# Patient Record
Sex: Male | Born: 1945 | Race: White | Hispanic: No | Marital: Married | Smoking: Never smoker
Health system: Southern US, Community
[De-identification: ages and names within clinical notes are randomized; demographics above are authoritative.]

## PROBLEM LIST (undated history)

## (undated) DIAGNOSIS — I10 Essential (primary) hypertension: Secondary | ICD-10-CM

## (undated) DIAGNOSIS — E785 Hyperlipidemia, unspecified: Secondary | ICD-10-CM

## (undated) HISTORY — DX: Essential (primary) hypertension: I10

## (undated) HISTORY — DX: Hyperlipidemia, unspecified: E78.5

---

## 2000-03-08 ENCOUNTER — Emergency Department (HOSPITAL_COMMUNITY): Admission: EM | Admit: 2000-03-08 | Discharge: 2000-03-08 | Payer: Self-pay | Admitting: Emergency Medicine

## 2003-03-31 ENCOUNTER — Emergency Department (HOSPITAL_COMMUNITY): Admission: EM | Admit: 2003-03-31 | Discharge: 2003-03-31 | Payer: Self-pay | Admitting: Emergency Medicine

## 2003-05-28 ENCOUNTER — Encounter: Admission: RE | Admit: 2003-05-28 | Discharge: 2003-05-28 | Payer: Self-pay | Admitting: Family Medicine

## 2003-06-29 ENCOUNTER — Ambulatory Visit (HOSPITAL_COMMUNITY): Admission: RE | Admit: 2003-06-29 | Discharge: 2003-06-29 | Payer: Self-pay | Admitting: Gastroenterology

## 2003-07-02 ENCOUNTER — Encounter: Admission: RE | Admit: 2003-07-02 | Discharge: 2003-07-02 | Payer: Self-pay | Admitting: Family Medicine

## 2017-08-19 DIAGNOSIS — R7303 Prediabetes: Secondary | ICD-10-CM | POA: Diagnosis not present

## 2017-08-19 DIAGNOSIS — Z79899 Other long term (current) drug therapy: Secondary | ICD-10-CM | POA: Diagnosis not present

## 2017-08-19 DIAGNOSIS — R69 Illness, unspecified: Secondary | ICD-10-CM | POA: Diagnosis not present

## 2017-08-19 DIAGNOSIS — Z0000 Encounter for general adult medical examination without abnormal findings: Secondary | ICD-10-CM | POA: Diagnosis not present

## 2017-08-19 DIAGNOSIS — N529 Male erectile dysfunction, unspecified: Secondary | ICD-10-CM | POA: Diagnosis not present

## 2017-08-19 DIAGNOSIS — E785 Hyperlipidemia, unspecified: Secondary | ICD-10-CM | POA: Diagnosis not present

## 2017-08-19 DIAGNOSIS — R030 Elevated blood-pressure reading, without diagnosis of hypertension: Secondary | ICD-10-CM | POA: Diagnosis not present

## 2017-10-05 DIAGNOSIS — R0981 Nasal congestion: Secondary | ICD-10-CM | POA: Diagnosis not present

## 2018-03-08 DIAGNOSIS — D225 Melanocytic nevi of trunk: Secondary | ICD-10-CM | POA: Diagnosis not present

## 2018-03-08 DIAGNOSIS — L570 Actinic keratosis: Secondary | ICD-10-CM | POA: Diagnosis not present

## 2018-03-08 DIAGNOSIS — D1801 Hemangioma of skin and subcutaneous tissue: Secondary | ICD-10-CM | POA: Diagnosis not present

## 2018-03-08 DIAGNOSIS — L814 Other melanin hyperpigmentation: Secondary | ICD-10-CM | POA: Diagnosis not present

## 2018-03-12 DIAGNOSIS — R69 Illness, unspecified: Secondary | ICD-10-CM | POA: Diagnosis not present

## 2018-05-10 ENCOUNTER — Observation Stay (HOSPITAL_COMMUNITY)
Admission: EM | Admit: 2018-05-10 | Discharge: 2018-05-12 | Disposition: A | Discharge status: Home / Self Care | Payer: Medicare HMO | Attending: Internal Medicine | Admitting: Internal Medicine

## 2018-05-10 ENCOUNTER — Emergency Department (HOSPITAL_COMMUNITY)
Admission: EM | Admit: 2018-05-10 | Discharge: 2018-05-10 | Disposition: A | Discharge status: Home / Self Care | Payer: Medicare HMO | Source: Home / Self Care | Attending: Emergency Medicine | Admitting: Emergency Medicine

## 2018-05-10 ENCOUNTER — Encounter (HOSPITAL_COMMUNITY): Payer: Self-pay | Admitting: Emergency Medicine

## 2018-05-10 ENCOUNTER — Emergency Department (HOSPITAL_COMMUNITY): Payer: Medicare HMO

## 2018-05-10 ENCOUNTER — Encounter (HOSPITAL_COMMUNITY): Payer: Self-pay

## 2018-05-10 ENCOUNTER — Other Ambulatory Visit: Payer: Self-pay

## 2018-05-10 DIAGNOSIS — R110 Nausea: Secondary | ICD-10-CM | POA: Diagnosis not present

## 2018-05-10 DIAGNOSIS — R0602 Shortness of breath: Secondary | ICD-10-CM | POA: Diagnosis not present

## 2018-05-10 DIAGNOSIS — R51 Headache: Secondary | ICD-10-CM

## 2018-05-10 DIAGNOSIS — I10 Essential (primary) hypertension: Secondary | ICD-10-CM | POA: Diagnosis not present

## 2018-05-10 DIAGNOSIS — Z79899 Other long term (current) drug therapy: Secondary | ICD-10-CM | POA: Insufficient documentation

## 2018-05-10 DIAGNOSIS — Z7982 Long term (current) use of aspirin: Secondary | ICD-10-CM | POA: Diagnosis not present

## 2018-05-10 DIAGNOSIS — R232 Flushing: Secondary | ICD-10-CM | POA: Diagnosis present

## 2018-05-10 DIAGNOSIS — I160 Hypertensive urgency: Secondary | ICD-10-CM | POA: Diagnosis not present

## 2018-05-10 DIAGNOSIS — E785 Hyperlipidemia, unspecified: Secondary | ICD-10-CM | POA: Diagnosis present

## 2018-05-10 DIAGNOSIS — R531 Weakness: Secondary | ICD-10-CM | POA: Diagnosis not present

## 2018-05-10 DIAGNOSIS — R0789 Other chest pain: Secondary | ICD-10-CM

## 2018-05-10 DIAGNOSIS — R079 Chest pain, unspecified: Secondary | ICD-10-CM

## 2018-05-10 DIAGNOSIS — R61 Generalized hyperhidrosis: Secondary | ICD-10-CM | POA: Diagnosis not present

## 2018-05-10 DIAGNOSIS — R0902 Hypoxemia: Secondary | ICD-10-CM | POA: Diagnosis not present

## 2018-05-10 LAB — BASIC METABOLIC PANEL
Anion gap: 13 (ref 5–15)
BUN: 16 mg/dL (ref 8–23)
CO2: 23 mmol/L (ref 22–32)
Calcium: 8.6 mg/dL — ABNORMAL LOW (ref 8.9–10.3)
Chloride: 104 mmol/L (ref 98–111)
Creatinine, Ser: 1.22 mg/dL (ref 0.61–1.24)
GFR calc Af Amer: >60 mL/min (ref >60)
GFR calc non Af Amer: 60 mL/min — ABNORMAL LOW (ref >60)
Glucose, Bld: 127 mg/dL — ABNORMAL HIGH (ref 70–99)
Potassium: 3.9 mmol/L (ref 3.5–5.1)
Sodium: 140 mmol/L (ref 135–145)

## 2018-05-10 LAB — CBC
HCT: 48.2 % (ref 39.0–52.0)
Hemoglobin: 15.3 g/dL (ref 13.0–17.0)
MCH: 28.6 pg (ref 26.0–34.0)
MCHC: 31.7 g/dL (ref 30.0–36.0)
MCV: 90.1 fL (ref 80.0–100.0)
Platelets: 254 10*3/uL (ref 150–400)
RBC: 5.35 MIL/uL (ref 4.22–5.81)
RDW: 12.6 % (ref 11.5–15.5)
WBC: 6.7 10*3/uL (ref 4.0–10.5)
nRBC: 0 % (ref 0.0–0.2)

## 2018-05-10 LAB — I-STAT TROPONIN, ED
Troponin i, poc: 0.01 ng/mL (ref 0.00–0.08)
Troponin i, poc: 0.01 ng/mL (ref 0.00–0.08)

## 2018-05-10 MED ORDER — AMLODIPINE BESYLATE 5 MG PO TABS: 5.0000 mg | ORAL_TABLET | Freq: Every day | ORAL | 0 refills | Status: DC

## 2018-05-10 MED ORDER — AMLODIPINE BESYLATE 5 MG PO TABS
5.0000 mg | ORAL_TABLET | Freq: Once | ORAL | Status: AC
  Administered 2018-05-10: 5 mg via ORAL
  Filled 2018-05-10: qty 1

## 2018-05-10 MED ORDER — ASPIRIN 81 MG PO CHEW
324.0000 mg | CHEWABLE_TABLET | Freq: Once | ORAL | Status: AC
  Administered 2018-05-10: 324 mg via ORAL
  Filled 2018-05-10: qty 4

## 2018-05-10 NOTE — ED Provider Notes (Signed)
[facility] Emergency Department Provider Note MRN:  [id number]  Arrival date & time: 05/10/18     Chief Complaint   Chest Pain   History of Present Illness   Randall Lane is a 72 y.o. year-old male with a history of hyperlipidemia presenting to the ED with chief complaint of chest pain.  At 545 this morning, patient got up from bed to urinate.  When he woke up back in bed, he began experiencing a pinching sensation in his left chest, mild in severity.  He then felt flushed throughout his entire body, lasting several minutes.  Associated with increased rate of respirations, which seemed to make him feel better.  Denies headache or vision change, no nausea or vomiting, no dizziness or diaphoresis.  Symptoms resolved very quickly.  Hypertensive with EMS.  Review of Systems  A complete 10 system review of systems was obtained and all systems are negative except as noted in the HPI and PMH.   Patient's Health History   Past medical history: Hyperlipidemia Family history: No family history of heart attack Social history: Never smoker History reviewed. No pertinent family history.  Social History   Socioeconomic History  . Marital status: Married    Spouse name: Not on file  . Number of children: Not on file  . Years of education: Not on file  . Highest education level: Not on file  Occupational History  . Not on file  Social Needs  . Financial resource strain: Not on file  . Food insecurity:    Worry: Not on file    Inability: Not on file  . Transportation needs:    Medical: Not on file    Non-medical: Not on file  Tobacco Use  . Smoking status: Not on file  Substance and Sexual Activity  . Alcohol use: Not on file  . Drug use: Not on file  . Sexual activity: Not on file  Lifestyle  . Physical activity:    Days per week: Not on file    Minutes per session: Not on file  . Stress: Not on file  Relationships  . Social connections:    Talks on phone:  Not on file    Gets together: Not on file    Attends religious service: Not on file    Active member of club or organization: Not on file    Attends meetings of clubs or organizations: Not on file    Relationship status: Not on file  . Intimate partner violence:    Fear of current or ex partner: Not on file    Emotionally abused: Not on file    Physically abused: Not on file    Forced sexual activity: Not on file  Other Topics Concern  . Not on file  Social History Narrative  . Not on file     Physical Exam  Vital Signs and Nursing Notes reviewed Vitals:   05/10/18 1215 05/10/18 1230  BP:  (!) 158/90  Pulse:    Resp:  (!) 22  Temp:    SpO2: 95%     CONSTITUTIONAL: Well-appearing, NAD NEURO:  Alert and oriented x 3, no focal deficits EYES:  eyes equal and reactive ENT/NECK:  no LAD, no JVD CARDIO: Regular rate, well-perfused, normal S1 and S2 PULM:  CTAB no wheezing or rhonchi GI/GU:  normal bowel sounds, non-distended, non-tender MSK/SPINE:  No gross deformities, no edema SKIN:  no rash, atraumatic PSYCH:  Appropriate speech and behavior  Diagnostic and  Interventional Summary    EKG Interpretation  Date/Time:  Tuesday May 10 2018 07:18:05 EST Ventricular Rate:  70 PR Interval:    QRS Duration: 95 QT Interval:  400 QTC Calculation: 432 R Axis:   10 Text Interpretation:  Sinus rhythm Supraventricular bigeminy Consider anterior infarct Baseline wander in lead(s) V2 Confirmed by Gerlene Fee [phone]) on 05/10/2018 12:51:59 PM      Labs Reviewed  BASIC METABOLIC PANEL - Abnormal; Notable for the following components:      Result Value   Glucose, Bld 127 (*)    Calcium 8.6 (*)    GFR calc non Af Amer 60 (*)    All other components within normal limits  CBC  I-STAT TROPONIN, ED  I-STAT TROPONIN, ED    DG Chest 2 View  Final Result      Medications  aspirin chewable tablet 324 mg (324 mg Oral Given 05/10/18 0740)     Procedures Critical Care  ED  Course and Medical Decision Making  I have reviewed the triage vital signs and the nursing notes.  Pertinent labs & imaging results that were available during my care of the patient were reviewed by me and considered in my medical decision making (see below for details).  Atypical chest pain presentation in this 72 year old male with very little cardiovascular risk factors.  Nothing to suggest pulmonary realism or dissection.  ACS a possibility, will need 2 troponins.  EKG with no ischemic changes.  Troponin negative x2.  Patient continues to feel well in the emergency department with no other symptoms.  Heart score of 3.  Still I offered to request admission for stress testing in the hospital.  Patient prefers to follow-up with PCP closely to discuss need for outpatient stress testing.  After the discussed management above, the patient was determined to be safe for discharge.  The patient was in agreement with this plan and all questions regarding their care were answered.  ED return precautions were discussed and the patient will return to the ED with any significant worsening of condition.  Barth Kirks. Sedonia Small, [place] [email or web address]  Final Clinical Impressions(s) / ED Diagnoses     ICD-10-CM   1. Chest pain R07.9 DG Chest 2 View    DG Chest 2 View    ED Discharge Orders    None         Maudie Flakes, MD 05/10/18 1253

## 2018-05-10 NOTE — ED Notes (Signed)
Discharge instructions discussed with Pt. Pt verbalized understanding. Pt stable and ambulatory.    

## 2018-05-10 NOTE — ED Triage Notes (Signed)
Pt arrives to ED from home with complaints of chest pain and shortness of breath this morning. EMS reports pt woke up and used the bathroom and while he was going back to bed he had a flushing feeling and now has been feeling off. Pt placed in position of comfort with bed locked and lowered, call bell in reach.

## 2018-05-10 NOTE — ED Triage Notes (Signed)
Patient BIB [facility] for weakness and hypertension. Patient was seen at this facility twice today for the same symptoms. Patient states when he got home he felt another "wave of weakness" that lasted approximately 20 min then another one about an hour later. Patient denies chest pain, abdominal pain, dizziness, or blurry vision.   BP 200/120 HR 110

## 2018-05-10 NOTE — ED Triage Notes (Signed)
Pt arrives POV for complaints of hypertension and "a wave of weakness over me" "my head feels like there is a toboggan on my head from the squeezing" Pt was seen here this morning for similar symptoms and discharged home. Pt a.o, nad noted in triage

## 2018-05-10 NOTE — ED Notes (Signed)
Discharge instructions and prescription discussed with Pt. Pt verbalized understanding. Pt stable and ambulatory.    

## 2018-05-10 NOTE — Discharge Instructions (Signed)
You were evaluated in the Emergency Department and after careful evaluation, we did not find any emergent condition requiring admission or further testing in the hospital.  Please follow-up with your primary care provider soon as possible to discuss the need for outpatient stress testing.  Your blood work today was reassuring, but it would be very important to return to the emergency department with any repeated episodes of chest pain.  You should also discuss your high blood pressure with your primary care provider.  Please return to the Emergency Department if you experience any worsening of your condition.  We encourage you to follow up with a primary care provider.  Thank you for allowing [place] to be a part of your care.

## 2018-05-10 NOTE — Discharge Instructions (Signed)
Follow-up with your doctor for further management of your blood pressure. 

## 2018-05-10 NOTE — ED Provider Notes (Signed)
[place] EMERGENCY DEPARTMENT Provider Note   CSN: [id number] Arrival date & time: 05/10/18  1753     History   Chief Complaint Chief Complaint  Patient presents with  . Hypertension    HPI Randall Lane is a 72 y.o. male.  HPI Patient presents with hypertension.  Seen in the ER earlier today for chest pain.  States he woke up this morning and felt a rush over his whole body.  Had some chest pain with it.  But states involved his whole body.  Has a dull headache.  Was seen in the ER and had 2- troponins EKG reassuring.  Blood pressure was elevated but did decrease somewhat on its own.  Does not have a history of hypertension.  Came back today because he states while he was sitting he had another episode where he felt a strange rash over his whole body.  No current chest pain.  Dull headache without numbness or weakness. History reviewed. No pertinent past medical history.  There are no active problems to display for this patient.   History reviewed. No pertinent surgical history.      Home Medications    Prior to Admission medications   Medication Sig Start Date End Date Taking? Authorizing Provider  amLODipine (NORVASC) 5 MG tablet Take 1 tablet (5 mg total) by mouth daily. 05/10/18   Davonna Belling, MD  aspirin EC 81 MG tablet Take 81 mg by mouth daily.    [provider]  atorvastatin (LIPITOR) 40 MG tablet Take 40 mg by mouth at bedtime.    [provider]  Multiple Vitamins-Minerals (MENS 50+ MULTI VITAMIN/MIN PO) Take 1 tablet by mouth daily.    [provider]  Omega-3 Fatty Acids (FISH OIL) 1000 MG CAPS Take 2,000 mg by mouth daily.    [provider]    Family History No family history on file.  Social History Social History   Tobacco Use  . Smoking status: Not on file  Substance Use Topics  . Alcohol use: Not on file  . Drug use: Not on file     Allergies   Patient has no known  allergies.   Review of Systems Review of Systems  Constitutional: Negative for fever.  HENT: Negative for congestion.   Eyes: Negative for visual disturbance.  Respiratory: Negative for choking.   Cardiovascular: Positive for chest pain.  Gastrointestinal: Negative for abdominal pain.  Genitourinary: Negative for flank pain.  Musculoskeletal: Negative for back pain.  Skin: Negative for rash.  Neurological: Positive for headaches. Negative for weakness and numbness.  Hematological: Negative for adenopathy.     Physical Exam Updated Vital Signs BP (!) 188/105   Pulse 75   Resp 16   SpO2 99%   Physical Exam HENT:     Head: Atraumatic.  Eyes:     Pupils: Pupils are equal, round, and reactive to light.  Neck:     Musculoskeletal: Normal range of motion.  Cardiovascular:     Rate and Rhythm: Normal rate.  Pulmonary:     Effort: Pulmonary effort is normal.  Abdominal:     Tenderness: There is no abdominal tenderness.  Musculoskeletal:     Right lower leg: No edema.     Left lower leg: No edema.  Skin:    General: Skin is warm.     Capillary Refill: Capillary refill takes less than 2 seconds.  Neurological:     General: No focal deficit present.  Mental Status: He is alert.      ED Treatments / Results  Labs (all labs ordered are listed, but only abnormal results are displayed) Labs Reviewed - No data to display  EKG EKG Interpretation  Date/Time:  Tuesday May 10 2018 18:37:09 EST Ventricular Rate:  73 PR Interval:    QRS Duration: 94 QT Interval:  401 QTC Calculation: 442 R Axis:   90 Text Interpretation:  Sinus rhythm Atrial premature complex Baseline wander in lead(s) V3 No significant change since last tracing Confirmed by Davonna Belling [phone]) on 05/10/2018 7:04:01 PM   Radiology Dg Chest 2 View  Result Date: 05/10/2018 CLINICAL DATA:  Chest pain with shortness of breath EXAM: CHEST - 2 VIEW COMPARISON:  March 27, 2016 FINDINGS:  There is no evident edema or consolidation. The heart size and pulmonary vascularity are normal. No adenopathy. There is degenerative change in the thoracic spine. IMPRESSION: No edema or consolidation. Electronically Signed   By: Lowella Grip III M.D.   On: 05/10/2018 08:15    Procedures Procedures (including critical care time)  Medications Ordered in ED Medications  amLODipine (NORVASC) tablet 5 mg (5 mg Oral Given 05/10/18 1925)     Initial Impression / Assessment and Plan / ED Course  I have reviewed the triage vital signs and the nursing notes.  Pertinent labs & imaging results that were available during my care of the patient were reviewed by me and considered in my medical decision making (see chart for details).     Patient with hypertension.  Had more extensive ischemia work-up earlier today.  EKG stable from prior.  Blood pressure is elevated however I do not see clear endorgan damage.  Will start on Norvasc and has outpatient follow-up with PCP.  Will discharge home.  Reviewed results from earlier today.  Final Clinical Impressions(s) / ED Diagnoses   Final diagnoses:  Hypertension, unspecified type    ED Discharge Orders         Ordered    amLODipine (NORVASC) 5 MG tablet  Daily,   Status:  Discontinued     05/10/18 1905    amLODipine (NORVASC) 5 MG tablet  Daily     05/10/18 Magdalene Molly, MD 05/10/18 [phone]

## 2018-05-11 ENCOUNTER — Encounter (HOSPITAL_COMMUNITY): Payer: Self-pay | Admitting: Family Medicine

## 2018-05-11 ENCOUNTER — Other Ambulatory Visit: Payer: Self-pay

## 2018-05-11 DIAGNOSIS — I10 Essential (primary) hypertension: Secondary | ICD-10-CM | POA: Insufficient documentation

## 2018-05-11 DIAGNOSIS — E785 Hyperlipidemia, unspecified: Secondary | ICD-10-CM | POA: Diagnosis not present

## 2018-05-11 DIAGNOSIS — I160 Hypertensive urgency: Secondary | ICD-10-CM

## 2018-05-11 DIAGNOSIS — R232 Flushing: Secondary | ICD-10-CM

## 2018-05-11 LAB — BASIC METABOLIC PANEL
Anion gap: 11 (ref 5–15)
BUN: 16 mg/dL (ref 8–23)
CO2: 25 mmol/L (ref 22–32)
Calcium: 8.9 mg/dL (ref 8.9–10.3)
Chloride: 101 mmol/L (ref 98–111)
Creatinine, Ser: 1.17 mg/dL (ref 0.61–1.24)
GFR calc Af Amer: >60 mL/min (ref >60)
GFR calc non Af Amer: >60 mL/min (ref >60)
Glucose, Bld: 166 mg/dL — ABNORMAL HIGH (ref 70–99)
Potassium: 4.1 mmol/L (ref 3.5–5.1)
Sodium: 137 mmol/L (ref 135–145)

## 2018-05-11 LAB — HIV ANTIBODY (ROUTINE TESTING W REFLEX): HIV Screen 4th Generation wRfx: NONREACTIVE

## 2018-05-11 LAB — COMPREHENSIVE METABOLIC PANEL
ALT: 20 U/L (ref 0–44)
AST: 21 U/L (ref 15–41)
Albumin: 3.8 g/dL (ref 3.5–5.0)
Alkaline Phosphatase: 98 U/L (ref 38–126)
Anion gap: 12 (ref 5–15)
BUN: 14 mg/dL (ref 8–23)
CO2: 22 mmol/L (ref 22–32)
Calcium: 8.9 mg/dL (ref 8.9–10.3)
Chloride: 104 mmol/L (ref 98–111)
Creatinine, Ser: 1.11 mg/dL (ref 0.61–1.24)
GFR calc Af Amer: >60 mL/min (ref >60)
GFR calc non Af Amer: >60 mL/min (ref >60)
Glucose, Bld: 146 mg/dL — ABNORMAL HIGH (ref 70–99)
Potassium: 3.8 mmol/L (ref 3.5–5.1)
Sodium: 138 mmol/L (ref 135–145)
Total Bilirubin: 0.8 mg/dL (ref 0.3–1.2)
Total Protein: 6.9 g/dL (ref 6.5–8.1)

## 2018-05-11 LAB — CBC WITH DIFFERENTIAL/PLATELET
Abs Immature Granulocytes: 0.05 10*3/uL (ref 0.00–0.07)
Basophils Absolute: 0.1 10*3/uL (ref 0.0–0.1)
Basophils Relative: 1 %
Eosinophils Absolute: 0 10*3/uL (ref 0.0–0.5)
Eosinophils Relative: 0 %
HCT: 45.3 % (ref 39.0–52.0)
Hemoglobin: 15.2 g/dL (ref 13.0–17.0)
Immature Granulocytes: 1 %
Lymphocytes Relative: 14 %
Lymphs Abs: 1.4 10*3/uL (ref 0.7–4.0)
MCH: 30 pg (ref 26.0–34.0)
MCHC: 33.6 g/dL (ref 30.0–36.0)
MCV: 89.3 fL (ref 80.0–100.0)
Monocytes Absolute: 0.5 10*3/uL (ref 0.1–1.0)
Monocytes Relative: 5 %
Neutro Abs: 7.8 10*3/uL — ABNORMAL HIGH (ref 1.7–7.7)
Neutrophils Relative %: 79 %
Platelets: 274 10*3/uL (ref 150–400)
RBC: 5.07 MIL/uL (ref 4.22–5.81)
RDW: 12.5 % (ref 11.5–15.5)
WBC: 9.8 10*3/uL (ref 4.0–10.5)
nRBC: 0 % (ref 0.0–0.2)

## 2018-05-11 LAB — I-STAT TROPONIN, ED: Troponin i, poc: 0 ng/mL (ref 0.00–0.08)

## 2018-05-11 LAB — RAPID URINE DRUG SCREEN, HOSP PERFORMED
Amphetamines: NOT DETECTED
Barbiturates: NOT DETECTED
Benzodiazepines: NOT DETECTED
Cocaine: NOT DETECTED
Opiates: NOT DETECTED
Tetrahydrocannabinol: NOT DETECTED

## 2018-05-11 LAB — ETHANOL: Alcohol, Ethyl (B): <10 mg/dL (ref <10)

## 2018-05-11 LAB — TSH: TSH: 3.41 u[IU]/mL (ref 0.350–4.500)

## 2018-05-11 MED ORDER — SODIUM CHLORIDE 0.9 % IV SOLN: 250.0000 mL | INTRAVENOUS | Status: DC | PRN

## 2018-05-11 MED ORDER — ACETAMINOPHEN 325 MG PO TABS: 650.0000 mg | ORAL_TABLET | Freq: Four times a day (QID) | ORAL | Status: DC | PRN

## 2018-05-11 MED ORDER — AMLODIPINE BESYLATE 10 MG PO TABS
10.0000 mg | ORAL_TABLET | Freq: Every day | ORAL | Status: DC
  Administered 2018-05-11 – 2018-05-12 (×2): 10 mg via ORAL
  Filled 2018-05-11 (×2): qty 1

## 2018-05-11 MED ORDER — HYDRALAZINE HCL 20 MG/ML IJ SOLN
5.0000 mg | Freq: Once | INTRAMUSCULAR | Status: AC
  Administered 2018-05-11: 5 mg via INTRAVENOUS
  Filled 2018-05-11: qty 1

## 2018-05-11 MED ORDER — SODIUM CHLORIDE 0.9% FLUSH
3.0000 mL | Freq: Two times a day (BID) | INTRAVENOUS | Status: DC
  Administered 2018-05-11 (×3): 3 mL via INTRAVENOUS

## 2018-05-11 MED ORDER — AMLODIPINE BESYLATE 5 MG PO TABS: 5.0000 mg | ORAL_TABLET | Freq: Every day | ORAL | Status: DC

## 2018-05-11 MED ORDER — ONDANSETRON HCL 4 MG/2ML IJ SOLN: 4.0000 mg | Freq: Four times a day (QID) | INTRAMUSCULAR | Status: DC | PRN

## 2018-05-11 MED ORDER — SODIUM CHLORIDE 0.9% FLUSH
3.0000 mL | INTRAVENOUS | Status: DC | PRN
  Administered 2018-05-11: 3 mL via INTRAVENOUS
  Filled 2018-05-11: qty 3

## 2018-05-11 MED ORDER — ASPIRIN EC 81 MG PO TBEC
81.0000 mg | DELAYED_RELEASE_TABLET | Freq: Every day | ORAL | Status: DC
  Administered 2018-05-11 – 2018-05-12 (×2): 81 mg via ORAL
  Filled 2018-05-11 (×2): qty 1

## 2018-05-11 MED ORDER — ATORVASTATIN CALCIUM 40 MG PO TABS
40.0000 mg | ORAL_TABLET | Freq: Every day | ORAL | Status: DC
  Administered 2018-05-11: 40 mg via ORAL
  Filled 2018-05-11: qty 1

## 2018-05-11 MED ORDER — ONDANSETRON HCL 4 MG PO TABS: 4.0000 mg | ORAL_TABLET | Freq: Four times a day (QID) | ORAL | Status: DC | PRN

## 2018-05-11 MED ORDER — SENNOSIDES-DOCUSATE SODIUM 8.6-50 MG PO TABS: 1.0000 | ORAL_TABLET | Freq: Every evening | ORAL | Status: DC | PRN

## 2018-05-11 MED ORDER — SODIUM CHLORIDE 0.9% FLUSH
3.0000 mL | Freq: Two times a day (BID) | INTRAVENOUS | Status: DC
  Administered 2018-05-11: 3 mL via INTRAVENOUS

## 2018-05-11 MED ORDER — ACETAMINOPHEN 650 MG RE SUPP: 650.0000 mg | Freq: Four times a day (QID) | RECTAL | Status: DC | PRN

## 2018-05-11 MED ORDER — LABETALOL HCL 5 MG/ML IV SOLN: 5.0000 mg | INTRAVENOUS | Status: DC | PRN

## 2018-05-11 MED ORDER — ENOXAPARIN SODIUM 40 MG/0.4ML ~~LOC~~ SOLN
40.0000 mg | SUBCUTANEOUS | Status: DC
  Administered 2018-05-11: 40 mg via SUBCUTANEOUS
  Filled 2018-05-11: qty 0.4

## 2018-05-11 NOTE — ED Notes (Signed)
Pt given urinal an made aware we need urine sample.

## 2018-05-11 NOTE — Progress Notes (Signed)
Patient admitted after midnight, please see H&P.  Plan to continue to titrate BP medications.  Await 24 hour urine collections. -denies stress to lifestyle changes No further episodes of flushing since 1 AM.  Eulogio Bear DO

## 2018-05-11 NOTE — ED Notes (Signed)
Nurse collected labs. 

## 2018-05-11 NOTE — ED Provider Notes (Signed)
[place] EMERGENCY DEPARTMENT Provider Note   CSN: [id number] Arrival date & time: 05/10/18  2335     History   Chief Complaint Chief Complaint  Patient presents with  . Weakness    HPI Randall Lane is a 72 y.o. male.  HPI  This is a 72 year old male with no reported past medical history who presents with flushing sensation and generalized weakness.  This is the patient's third visit in less than 24 hours.  Patient reports that he has had 3-4 distinct episodes today of whole body flushing that have lasted from 10 minutes to 40 minutes.  At that time he has not had any shortness of breath or chest pain.  Initial episode was this morning.  At that time he had a full work-up in the ED to include serial troponins which were reassuring.  He has noted that his blood pressure was high.  No history of high blood pressure in the past.  Reports his normal blood pressure is 235 systolic.  He was seen a second time this evening and given 1 dose of amlodipine prior to discharge.  He states that he went home and had 2 distinct episodes of full body flushing.  During the last episode he had bilateral arm and bilateral leg "coolness" associated with the symptoms.  At no time did he have any strokelike symptoms including weakness, numbness, speech difficulty.  Patient states his symptoms have largely self resolved; however, he is very distressed.  Initial blood pressure on EMS arrival was in the 190s.  Recent illnesses or fevers.  Denies any drug or alcohol use.  History reviewed. No pertinent past medical history.  Patient Active Problem List   Diagnosis Date Noted  . Hypertensive urgency 05/11/2018  . Essential hypertension 05/11/2018    History reviewed. No pertinent surgical history.      Home Medications    Prior to Admission medications   Medication Sig Start Date End Date Taking? Authorizing Provider  aspirin EC 81 MG tablet Take 81 mg by mouth daily.   Yes  [provider]  atorvastatin (LIPITOR) 40 MG tablet Take 40 mg by mouth at bedtime.   Yes [provider]  Multiple Vitamins-Minerals (MENS 50+ MULTI VITAMIN/MIN PO) Take 1 tablet by mouth daily.   Yes [provider]  Omega-3 Fatty Acids (FISH OIL) 1000 MG CAPS Take 2,000 mg by mouth daily.   Yes [provider]  amLODipine (NORVASC) 5 MG tablet Take 1 tablet (5 mg total) by mouth daily. 05/10/18   Davonna Belling, MD    Family History History reviewed. No pertinent family history.  Social History Social History   Tobacco Use  . Smoking status: Not on file  Substance Use Topics  . Alcohol use: Not on file  . Drug use: Not on file     Allergies   Patient has no known allergies.   Review of Systems Review of Systems  Constitutional: Negative for fever.  Respiratory: Negative for shortness of breath.   Cardiovascular: Negative for chest pain and leg swelling.  Gastrointestinal: Negative for abdominal pain, nausea and vomiting.  Genitourinary: Negative for dysuria.  Neurological:       Whole body flushing  All other systems reviewed and are negative.    Physical Exam Updated Vital Signs BP (!) 181/95   Pulse 87   Temp 98.4 F (36.9 C) (Oral)   Resp (!) 21   Ht 1.727 m (5\' 8" )   Wt 83.9  kg   SpO2 98%   BMI 28.13 kg/m   Physical Exam Vitals signs and nursing note reviewed.  Constitutional:      Appearance: He is well-developed. He is not ill-appearing or toxic-appearing.  HENT:     Head: Normocephalic and atraumatic.  Eyes:     Pupils: Pupils are equal, round, and reactive to light.  Neck:     Musculoskeletal: Normal range of motion and neck supple.  Cardiovascular:     Rate and Rhythm: Normal rate and regular rhythm.     Heart sounds: Normal heart sounds. No murmur.  Pulmonary:     Effort: Pulmonary effort is normal. No respiratory distress.     Breath sounds: Normal breath sounds. No wheezing.  Abdominal:      General: Bowel sounds are normal.     Palpations: Abdomen is soft.     Tenderness: There is no abdominal tenderness. There is no rebound.  Musculoskeletal:        General: No tenderness.     Right lower leg: No edema.     Left lower leg: No edema.     Comments: 1+ DP pulse bilaterally, feet warm  Lymphadenopathy:     Cervical: No cervical adenopathy.  Skin:    General: Skin is warm and dry.  Neurological:     Mental Status: He is alert and oriented to person, place, and time.     Comments: Cranial nerves II through XII intact, 5 out of 5 strength in all 4 extremities, no dysmetria to finger-nose-finger  Psychiatric:        Mood and Affect: Mood normal.      ED Treatments / Results  Labs (all labs ordered are listed, but only abnormal results are displayed) Labs Reviewed  CBC WITH DIFFERENTIAL/PLATELET - Abnormal; Notable for the following components:      Result Value   Neutro Abs 7.8 (*)    All other components within normal limits  BASIC METABOLIC PANEL - Abnormal; Notable for the following components:   Glucose, Bld 166 (*)    All other components within normal limits  ETHANOL  RAPID URINE DRUG SCREEN, HOSP PERFORMED  HIV ANTIBODY (ROUTINE TESTING W REFLEX)  TSH  COMPREHENSIVE METABOLIC PANEL  I-STAT TROPONIN, ED    EKG EKG Interpretation  Date/Time:  Wednesday May 11 2018 00:25:49 EST Ventricular Rate:  78 PR Interval:    QRS Duration: 97 QT Interval:  376 QTC Calculation: 429 R Axis:   5 Text Interpretation:  Sinus rhythm Atrial premature complex No significant change since last tracing Confirmed by Thayer Jew [phone]) on 05/11/2018 12:38:27 AM   Radiology Dg Chest 2 View  Result Date: 05/10/2018 CLINICAL DATA:  Chest pain with shortness of breath EXAM: CHEST - 2 VIEW COMPARISON:  March 27, 2016 FINDINGS: There is no evident edema or consolidation. The heart size and pulmonary vascularity are normal. No adenopathy. There is degenerative change  in the thoracic spine. IMPRESSION: No edema or consolidation. Electronically Signed   By: Lowella Grip III M.D.   On: 05/10/2018 08:15    Procedures Procedures (including critical care time)  Medications Ordered in ED Medications  aspirin EC tablet 81 mg (has no administration in time range)  amLODipine (NORVASC) tablet 5 mg (has no administration in time range)  atorvastatin (LIPITOR) tablet 40 mg (has no administration in time range)  enoxaparin (LOVENOX) injection 40 mg (has no administration in time range)  sodium chloride flush (NS) 0.9 % injection 3 mL (has  no administration in time range)  sodium chloride flush (NS) 0.9 % injection 3 mL (has no administration in time range)  sodium chloride flush (NS) 0.9 % injection 3 mL (has no administration in time range)  0.9 %  sodium chloride infusion (has no administration in time range)  acetaminophen (TYLENOL) tablet 650 mg (has no administration in time range)    Or  acetaminophen (TYLENOL) suppository 650 mg (has no administration in time range)  senna-docusate (Senokot-S) tablet 1 tablet (has no administration in time range)  ondansetron (ZOFRAN) tablet 4 mg (has no administration in time range)    Or  ondansetron (ZOFRAN) injection 4 mg (has no administration in time range)  labetalol (NORMODYNE,TRANDATE) injection 5-10 mg (has no administration in time range)  hydrALAZINE (APRESOLINE) injection 5 mg (5 mg Intravenous Given 05/11/18 0021)     Initial Impression / Assessment and Plan / ED Course  I have reviewed the triage vital signs and the nursing notes.  Pertinent labs & imaging results that were available during my care of the patient were reviewed by me and considered in my medical decision making (see chart for details).     Presents with recurrent episodes of full body flushing and generalized weakness.  He is overall nontoxic-appearing.  Again he is hypertensive upon arrival with systolic blood pressures in the  190s.  This is his third visit in less than 24 hours.  He denies any strokelike symptoms chest pain, shortness of breath.  He is neurovascular intact with good peripheral pulses.  He is neurologically intact.  Unclear etiology of symptoms.  However, given fairly new onset of hypertension, his symptoms could be hypertensive urgency.  Patient was given IV hydralazine.  Repeat blood work is reassuring including troponin.  Doubt ACS.  EKG shows no evidence of arrhythmia or ischemia.  UDS and EtOH are negative.  Given that this is his third visit for the day and he may be having a mild hypertensive urgency, will admit for observation and blood pressure control.  Patient and his wife are updated and I discussed with the admitting hospitalist.  Final Clinical Impressions(s) / ED Diagnoses   Final diagnoses:  Hypertensive urgency    ED Discharge Orders    None       Horton, Barbette Hair, MD 05/11/18 [phone]

## 2018-05-11 NOTE — H&P (Signed)
History and Physical    Randall Lane HBZ:[id number] DOB: 04/30/1946 DOA: 05/10/2018  PCP: Randall Melter, MD   Patient coming from: Home   Chief Complaint: flushing   HPI: Randall Lane is a 72 y.o. male with medical history significant for hyperlipidemia, now presenting to the emergency department for evaluation of flushing sensation.  Patient reports that he generally enjoys good health, has always been physically active, takes Lipitor for hyperlipidemia but reports that his most recent lipid panel was normal.  He was in his usual state until around 5:30 AM on 05/10/2018 when, shortly after waking, he developed a vague sensation of flushing involving his whole body with general feeling of unwellness.  This resolved spontaneously after several minutes and he was evaluated in the emergency department at that time with normal basic blood work, troponin, and chest x-ray.  Shortly after returning home, the patient had another episode of flushing, also self-limited, return to the ED, was noted to be hypertensive and started on Norvasc with instructions to follow-up with PCP.  He went on to have another episode at home, called EMS, found to be hypertensive to 200/120, and brought back into the ED.  ED Course: Upon arrival to the ED, patient is found to be hypertensive to 190/100 and vitals otherwise normal.  EKG features a sinus rhythm with PAC.  Chemistry panel is notable for glucose of 166 and CBC is unremarkable.  UDS is negative and ethanol undetectable.  Troponin was negative x3.  Patient was given a dose of IV hydralazine.  He had another episode of flushing in the ED lasting several minutes before resolving spontaneously.  There is no arrhythmia noted on the monitor during this event.  Hospitalists are asked to observe the patient for further evaluation and management.  Review of Systems:  All other systems reviewed and apart from HPI, are negative.  History reviewed. No pertinent past  medical history.  History reviewed. No pertinent surgical history.   has no history on file for tobacco, alcohol, and drug.  No Known Allergies  History reviewed. No pertinent family history.   Prior to Admission medications   Medication Sig Start Date End Date Taking? Authorizing Provider  aspirin EC 81 MG tablet Take 81 mg by mouth daily.   Yes [provider]  atorvastatin (LIPITOR) 40 MG tablet Take 40 mg by mouth at bedtime.   Yes [provider]  Multiple Vitamins-Minerals (MENS 50+ MULTI VITAMIN/MIN PO) Take 1 tablet by mouth daily.   Yes [provider]  Omega-3 Fatty Acids (FISH OIL) 1000 MG CAPS Take 2,000 mg by mouth daily.   Yes [provider]  amLODipine (NORVASC) 5 MG tablet Take 1 tablet (5 mg total) by mouth daily. 05/10/18   Davonna Belling, MD    Physical Exam: Vitals:   05/11/18 0030 05/11/18 0045 05/11/18 0100 05/11/18 0115  BP: (!) 173/91 (!) 185/90 (!) 167/78 (!) 181/95  Pulse: 82 88 78 87  Resp: (!) 24 (!) 23 20 (!) 21  Temp:      TempSrc:      SpO2: 95% 96% 97% 98%  Weight:      Height:        Constitutional: NAD, calm  Eyes: PERTLA, lids and conjunctivae normal ENMT: Mucous membranes are moist. Posterior pharynx clear of any exudate or lesions.   Neck: normal, supple, no masses, no thyromegaly Respiratory: clear to auscultation bilaterally, no wheezing, no crackles. Normal respiratory effort.    Cardiovascular: S1 &  S2 heard, regular rate and rhythm. No extremity edema. 2+ pedal pulses.   Abdomen: No distension, no tenderness, soft. Bowel sounds active.  Musculoskeletal: no clubbing / cyanosis. No joint deformity upper and lower extremities.    Skin: no significant rashes, lesions, ulcers. Warm, dry, well-perfused. Neurologic: CN 2-12 grossly intact. Sensation intact, DTR normal. Strength 5/5 in all 4 limbs.  Psychiatric: Alert and oriented x 3. Calm, cooperative.    Labs on Admission: I have personally  reviewed following labs and imaging studies  CBC: Recent Labs  Lab 05/10/18 0744 05/11/18 0018  WBC 6.7 9.8  NEUTROABS  --  7.8*  HGB 15.3 15.2  HCT 48.2 45.3  MCV 90.1 89.3  PLT 254 588   Basic Metabolic Panel: Recent Labs  Lab 05/10/18 0744 05/11/18 0018  NA 140 137  K 3.9 4.1  CL 104 101  CO2 23 25  GLUCOSE 127* 166*  BUN 16 16  CREATININE 1.22 1.17  CALCIUM 8.6* 8.9   GFR: Estimated Creatinine Clearance: 62 mL/min (by C-G formula based on SCr of 1.17 mg/dL). Liver Function Tests: No results for input(s): AST, ALT, ALKPHOS, BILITOT, PROT, ALBUMIN in the last 168 hours. No results for input(s): LIPASE, AMYLASE in the last 168 hours. No results for input(s): AMMONIA in the last 168 hours. Coagulation Profile: No results for input(s): INR, PROTIME in the last 168 hours. Cardiac Enzymes: No results for input(s): CKTOTAL, CKMB, CKMBINDEX, TROPONINI in the last 168 hours. BNP (last 3 results) No results for input(s): PROBNP in the last 8760 hours. HbA1C: No results for input(s): HGBA1C in the last 72 hours. CBG: No results for input(s): GLUCAP in the last 168 hours. Lipid Profile: No results for input(s): CHOL, HDL, LDLCALC, TRIG, CHOLHDL, LDLDIRECT in the last 72 hours. Thyroid Function Tests: No results for input(s): TSH, T4TOTAL, FREET4, T3FREE, THYROIDAB in the last 72 hours. Anemia Panel: No results for input(s): VITAMINB12, FOLATE, FERRITIN, TIBC, IRON, RETICCTPCT in the last 72 hours. Urine analysis: No results found for: COLORURINE, APPEARANCEUR, LABSPEC, PHURINE, GLUCOSEU, HGBUR, BILIRUBINUR, KETONESUR, PROTEINUR, UROBILINOGEN, NITRITE, LEUKOCYTESUR Sepsis Labs: @LABRCNTIP (procalcitonin:4,lacticidven:4) )No results found for this or any previous visit (from the past 240 hour(s)).   Radiological Exams on Admission: Dg Chest 2 View  Result Date: 05/10/2018 CLINICAL DATA:  Chest pain with shortness of breath EXAM: CHEST - 2 VIEW COMPARISON:  March 27, 2016 FINDINGS: There is no evident edema or consolidation. The heart size and pulmonary vascularity are normal. No adenopathy. There is degenerative change in the thoracic spine. IMPRESSION: No edema or consolidation. Electronically Signed   By: Lowella Grip III M.D.   On: 05/10/2018 08:15    EKG: Independently reviewed. Sinus rhythm, PAC.   Assessment/Plan   1. Hypertensive urgency; flushing   - Presents with recurrent episodes of flushing with return to normal between episodes, and is found to be hypertensive with BP as high as 200/120 with normal basic labs, troponin, CXR, and non-focal neuro exam  - 3 ED visit in 18 hours for this  - There is some ectopy, but no arrhythmias on EKG or cardiac monitoring in ED; denies focal deficit and none identified on exam; strong and equal pulses in all extremities; does not appear to be in any distress  - UDS negative, EtOH undetectable  - DDx is broad but suspicion for acutely life-threatening etiology is low  - Continue cardiac monitoring, check TSH, metanephrines, catacholamines, 5-HIAA - Started on Norvasc by ED provider during earlier visit, will continue;  may need second agent, will use labetalol IVP's as-needed for now    2. Hyperlipidemia  - Continue Lipitor    DVT prophylaxis: Lovenox  Code Status: Full  Family Communication: Wife updated at bedside  Consults called: None Admission status: Observation     Vianne Bulls, MD [facility] Pager [phone]  If 7PM-7AM, please contact night-coverage [email or web address] Password [email or web address]  05/11/2018, 2:09 AM

## 2018-05-11 NOTE — Care Management Obs Status (Signed)
[place] NOTIFICATION   Patient Details  Name: Randall Lane MRN: [id number] Date of Birth: [birth year]   Medicare Observation Status Notification Given:  Yes    Midge Minium RN, BSN, NCM-BC, ACM-RN [phone] 05/11/2018, 4:40 PM

## 2018-05-12 DIAGNOSIS — I160 Hypertensive urgency: Secondary | ICD-10-CM | POA: Diagnosis not present

## 2018-05-12 DIAGNOSIS — E785 Hyperlipidemia, unspecified: Secondary | ICD-10-CM | POA: Diagnosis not present

## 2018-05-12 MED ORDER — AMLODIPINE BESYLATE 5 MG PO TABS: 10.0000 mg | ORAL_TABLET | Freq: Every day | ORAL | 0 refills | Status: DC

## 2018-05-12 NOTE — Discharge Summary (Signed)
Physician Discharge Summary  GEORG ANG WCH:[id number] DOB: Mar 28, 1946 DOA: 05/10/2018  PCP: Orpah Melter, MD  Admit date: 05/10/2018 Discharge date: 05/12/2018  Admitted From: home Discharge disposition: home   Recommendations for Outpatient Follow-Up:   1. 24 hour urine studies pending 2. Titration of BP medications as needed   Discharge Diagnosis:   Principal Problem:   Hypertensive urgency Active Problems:   Flushing   Hyperlipidemia    Discharge Condition: Improved.  Diet recommendation: Low sodium, heart healthy  Wound care: None.  Code status: Full.   History of Present Illness:   Randall Lane is a 72 y.o. male with medical history significant for hyperlipidemia, now presenting to the emergency department for evaluation of flushing sensation.  Patient reports that he generally enjoys good health, has always been physically active, takes Lipitor for hyperlipidemia but reports that his most recent lipid panel was normal.  He was in his usual state until around 5:30 AM on 05/10/2018 when, shortly after waking, he developed a vague sensation of flushing involving his whole body with general feeling of unwellness.  This resolved spontaneously after several minutes and he was evaluated in the emergency department at that time with normal basic blood work, troponin, and chest x-ray.  Shortly after returning home, the patient had another episode of flushing, also self-limited, return to the ED, was noted to be hypertensive and started on Norvasc with instructions to follow-up with PCP.  He went on to have another episode at home, called EMS, found to be hypertensive to 200/120, and brought back into the ED.  ED Course: Upon arrival to the ED, patient is found to be hypertensive to 190/100 and vitals otherwise normal.  EKG features a sinus rhythm with PAC.  Chemistry panel is notable for glucose of 166 and CBC is unremarkable.  UDS is negative and ethanol  undetectable.  Troponin was negative x3.  Patient was given a dose of IV hydralazine.  He had another episode of flushing in the ED lasting several minutes before resolving spontaneously.  There is no arrhythmia noted on the monitor during this event.  Hospitalists are asked to observe the patient for further evaluation and management.   Hospital Course by Problem:   HTN -improved on 10 mg norvasc -24 hour urine started in ER PNT:[id number], catacholamines, 5-HIAA (results still pending)  HLD -continue lipitor   Medical Consultants:      Discharge Exam:   Vitals:   05/11/18 2334 05/12/18 0544  BP: (!) 152/90 124/82  Pulse: 70 62  Resp: 18 18  Temp: 98.2 F (36.8 C) 98 F (36.7 C)  SpO2: 95% 98%   Vitals:   05/11/18 1203 05/11/18 1727 05/11/18 2334 05/12/18 0544  BP: (!) 180/103 (!) 149/87 (!) 152/90 124/82  Pulse: 67 79 70 62  Resp: 18 17 18 18   Temp: 98 F (36.7 C) 98.1 F (36.7 C) 98.2 F (36.8 C) 98 F (36.7 C)  TempSrc: Oral Oral Oral Oral  SpO2: 97% 98% 95% 98%  Weight:      Height:        General exam: Appears calm and comfortable.   The results of significant diagnostics from this hospitalization (including imaging, microbiology, ancillary and laboratory) are listed below for reference.     Procedures and Diagnostic Studies:   Dg Chest 2 View  Result Date: 05/10/2018 CLINICAL DATA:  Chest pain with shortness of breath EXAM: CHEST - 2 VIEW COMPARISON:  March 27, 2016 FINDINGS:  There is no evident edema or consolidation. The heart size and pulmonary vascularity are normal. No adenopathy. There is degenerative change in the thoracic spine. IMPRESSION: No edema or consolidation. Electronically Signed   By: Lowella Grip III M.D.   On: 05/10/2018 08:15     Labs:   Basic Metabolic Panel: Recent Labs  Lab 05/10/18 0744 05/11/18 0018 05/11/18 0227  NA 140 137 138  K 3.9 4.1 3.8  CL 104 101 104  CO2 23 25 22   GLUCOSE 127* 166* 146*  BUN  16 16 14   CREATININE 1.22 1.17 1.11  CALCIUM 8.6* 8.9 8.9   GFR Estimated Creatinine Clearance: 65.3 mL/min (by C-G formula based on SCr of 1.11 mg/dL). Liver Function Tests: Recent Labs  Lab 05/11/18 0227  AST 21  ALT 20  ALKPHOS 98  BILITOT 0.8  PROT 6.9  ALBUMIN 3.8   No results for input(s): LIPASE, AMYLASE in the last 168 hours. No results for input(s): AMMONIA in the last 168 hours. Coagulation profile No results for input(s): INR, PROTIME in the last 168 hours.  CBC: Recent Labs  Lab 05/10/18 0744 05/11/18 0018  WBC 6.7 9.8  NEUTROABS  --  7.8*  HGB 15.3 15.2  HCT 48.2 45.3  MCV 90.1 89.3  PLT 254 274   Cardiac Enzymes: No results for input(s): CKTOTAL, CKMB, CKMBINDEX, TROPONINI in the last 168 hours. BNP: Invalid input(s): POCBNP CBG: No results for input(s): GLUCAP in the last 168 hours. D-Dimer No results for input(s): DDIMER in the last 72 hours. Hgb A1c No results for input(s): HGBA1C in the last 72 hours. Lipid Profile No results for input(s): CHOL, HDL, LDLCALC, TRIG, CHOLHDL, LDLDIRECT in the last 72 hours. Thyroid function studies Recent Labs    05/11/18 0227  TSH 3.410   Anemia work up No results for input(s): VITAMINB12, FOLATE, FERRITIN, TIBC, IRON, RETICCTPCT in the last 72 hours. Microbiology No results found for this or any previous visit (from the past 240 hour(s)).   Discharge Instructions:   Discharge Instructions    Diet - low sodium heart healthy   Complete by:  As directed    Discharge instructions   Complete by:  As directed    Follow up with PCP for further medicine adjustments See attached information about low salt diet   Increase activity slowly   Complete by:  As directed      Allergies as of 05/12/2018   No Known Allergies     Medication List    TAKE these medications   amLODipine 5 MG tablet Commonly known as:  NORVASC Take 2 tablets (10 mg total) by mouth daily. What changed:  how much to take     aspirin EC 81 MG tablet Take 81 mg by mouth daily.   atorvastatin 40 MG tablet Commonly known as:  LIPITOR Take 40 mg by mouth at bedtime.   Fish Oil 1000 MG Caps Take 2,000 mg by mouth daily.   MENS 50+ MULTI VITAMIN/MIN PO Take 1 tablet by mouth daily.      Follow-up Information    Orpah Melter, MD Follow up in 1 week(s).   Specialty:  Family Medicine Why:  results of urine studies Contact information: [address] [place] [address] [phone]            Time coordinating discharge: 25 min  Signed:  Geradine Girt DO  [facility] 05/12/2018, 11:05 AM

## 2018-05-12 NOTE — Discharge Instructions (Signed)
Preventing Hypertension Hypertension, commonly called high blood pressure, is when the force of blood pumping through the arteries is too strong. Arteries are blood vessels that carry blood from the heart throughout the body. Over time, hypertension can damage the arteries and decrease blood flow to important parts of the body, including the brain, heart, and kidneys. Often, hypertension does not cause symptoms until blood pressure is very high. For this reason, it is important to have your blood pressure checked on a regular basis. Hypertension can often be prevented with diet and lifestyle changes. If you already have hypertension, you can control it with diet and lifestyle changes, as well as medicine. What nutrition changes can be made? Maintain a healthy diet. This includes:  Eating less salt (sodium). Ask your health care provider how much sodium is safe for you to have. The general recommendation is to consume less than 1 tsp (2,300 mg) of sodium a day. ? Do not add salt to your food. ? Choose low-sodium options when grocery shopping and eating out.  Limiting fats in your diet. You can do this by eating low-fat or fat-free dairy products and by eating less red meat.  Eating more fruits, vegetables, and whole grains. Make a goal to eat: ? 1-2 cups of fresh fruits and vegetables each day. ? 3-4 servings of whole grains each day.  Avoiding foods and beverages that have added sugars.  Eating fish that contain healthy fats (omega-3 fatty acids), such as mackerel or salmon. If you need help putting together a healthy eating plan, try the DASH diet. This diet is high in fruits, vegetables, and whole grains. It is low in sodium, red meat, and added sugars. DASH stands for Dietary Approaches to Stop Hypertension. What lifestyle changes can be made?   Lose weight if you are overweight. Losing just 3?5% of your body weight can help prevent or control hypertension. ? For example, if your present  weight is 200 lb (91 kg), a loss of 3-5% of your weight means losing 6-10 lb (2.7-4.5 kg). ? Ask your health care provider to help you with a diet and exercise plan to safely lose weight.  Get enough exercise. Do at least 150 minutes of moderate-intensity exercise each week. ? You could do this in short exercise sessions several times a day, or you could do longer exercise sessions a few times a week. For example, you could take a brisk 10-minute walk or bike ride, 3 times a day, for 5 days a week.  Find ways to reduce stress, such as exercising, meditating, listening to music, or taking a yoga class. If you need help reducing stress, ask your health care provider.  Do not smoke. This includes e-cigarettes. Chemicals in tobacco and nicotine products raise your blood pressure each time you smoke. If you need help quitting, ask your health care provider.  Avoid alcohol. If you drink alcohol, limit alcohol intake to no more than 1 drink a day for nonpregnant women and 2 drinks a day for men. One drink equals 12 oz of beer, 5 oz of wine, or 1 oz of hard liquor. Why are these changes important? Diet and lifestyle changes can help you prevent hypertension, and they may make you feel better overall and improve your quality of life. If you have hypertension, making these changes will help you control it and help prevent major complications, such as:  Hardening and narrowing of arteries that supply blood to: ? Your heart. This can cause a heart  it and help prevent major complications, such as:   Hardening and narrowing of arteries that supply blood to:  ? Your heart. This can cause a heart attack.  ? Your brain. This can cause a stroke.  ? Your kidneys. This can cause kidney failure.   Stress on your heart muscle, which can cause heart failure.  What can I do to lower my risk?   Work with your health care provider to make a hypertension prevention plan that works for you. Follow your plan and keep all follow-up visits as told by your health care provider.   Learn how to check your blood pressure at home. Make sure that you know your personal target  blood pressure, as told by your health care provider.  How is this treated?  In addition to diet and lifestyle changes, your health care provider may recommend medicines to help lower your blood pressure. You may need to try a few different medicines to find what works best for you. You also may need to take more than one medicine. Take over-the-counter and prescription medicines only as told by your health care provider.  Where to find support  Your health care provider can help you prevent hypertension and help you keep your blood pressure at a healthy level. Your local hospital or your community may also provide support services and prevention programs.  The American Heart Association offers an online support network at: http://supportnetwork.heart.org/high-blood-pressure  Where to find more information  Learn more about hypertension from:   National Heart, Lung, and Blood Institute: www.nhlbi.nih.gov/health/health-topics/topics/hbp   Centers for Disease Control and Prevention: www.cdc.gov/bloodpressure   American Academy of Family Physicians: http://familydoctor.org/familydoctor/en/diseases-conditions/high-blood-pressure.printerview.all.html  Learn more about the DASH diet from:   National Heart, Lung, and Blood Institute: www.nhlbi.nih.gov/health/health-topics/topics/dash  Contact a health care provider if:   You think you are having a reaction to medicines you have taken.   You have recurrent headaches or feel dizzy.   You have swelling in your ankles.   You have trouble with your vision.  Summary   Hypertension often does not cause any symptoms until blood pressure is very high. It is important to get your blood pressure checked regularly.   Diet and lifestyle changes are the most important steps in preventing hypertension.   By keeping your blood pressure in a healthy range, you can prevent complications like heart attack, heart failure, stroke, and kidney failure.   Work with your health care  provider to make a hypertension prevention plan that works for you.  This information is not intended to replace advice given to you by your health care provider. Make sure you discuss any questions you have with your health care provider.  Document Released: 05/26/2015 Document Revised: 01/20/2016 Document Reviewed: 01/20/2016  Elsevier Interactive Patient Education  2019 Elsevier Inc.

## 2018-05-15 LAB — METANEPHRINES, URINE, 24 HOUR
Metaneph Total, Ur: 59 ug/L
Metanephrines, 24H Ur: 109 ug/24 hr (ref 45–290)
Normetanephrine, 24H Ur: 309 ug/24 hr (ref 82–500)
Normetanephrine, Ur: 167 ug/L
Total Volume: 1850

## 2018-05-16 LAB — 5 HIAA, QUANTITATIVE, URINE, 24 HOUR: Total Volume: 1850

## 2018-05-17 LAB — CATECHOLAMINES,UR.,FREE,24 HR
Dopamine, Rand Ur: 116 ug/L
Dopamine, Ur, 24Hr: 215 ug/24 hr (ref 0–510)
Epinephrine, Rand Ur: 1 ug/L
Epinephrine, U, 24Hr: 2 ug/24 hr (ref 0–20)
Norepinephrine, Rand Ur: 26 ug/L
Norepinephrine,U,24H: 48 ug/24 hr (ref 0–135)
Total Volume: 1850

## 2018-05-20 DIAGNOSIS — I10 Essential (primary) hypertension: Secondary | ICD-10-CM | POA: Diagnosis not present

## 2018-06-21 DIAGNOSIS — I10 Essential (primary) hypertension: Secondary | ICD-10-CM | POA: Diagnosis not present

## 2018-06-21 DIAGNOSIS — E785 Hyperlipidemia, unspecified: Secondary | ICD-10-CM | POA: Diagnosis not present

## 2018-07-26 DIAGNOSIS — Z23 Encounter for immunization: Secondary | ICD-10-CM | POA: Diagnosis not present

## 2018-12-06 DIAGNOSIS — Z23 Encounter for immunization: Secondary | ICD-10-CM | POA: Diagnosis not present

## 2018-12-06 DIAGNOSIS — N529 Male erectile dysfunction, unspecified: Secondary | ICD-10-CM | POA: Diagnosis not present

## 2018-12-06 DIAGNOSIS — I10 Essential (primary) hypertension: Secondary | ICD-10-CM | POA: Diagnosis not present

## 2018-12-06 DIAGNOSIS — E785 Hyperlipidemia, unspecified: Secondary | ICD-10-CM | POA: Diagnosis not present

## 2019-02-13 DIAGNOSIS — R69 Illness, unspecified: Secondary | ICD-10-CM | POA: Diagnosis not present

## 2019-02-19 DIAGNOSIS — R69 Illness, unspecified: Secondary | ICD-10-CM | POA: Diagnosis not present

## 2019-02-19 DIAGNOSIS — I10 Essential (primary) hypertension: Secondary | ICD-10-CM | POA: Diagnosis not present

## 2019-06-19 DIAGNOSIS — R69 Illness, unspecified: Secondary | ICD-10-CM | POA: Diagnosis not present

## 2019-06-19 DIAGNOSIS — I10 Essential (primary) hypertension: Secondary | ICD-10-CM | POA: Diagnosis not present

## 2019-06-29 DIAGNOSIS — I10 Essential (primary) hypertension: Secondary | ICD-10-CM | POA: Diagnosis not present

## 2019-06-29 DIAGNOSIS — E785 Hyperlipidemia, unspecified: Secondary | ICD-10-CM | POA: Diagnosis not present

## 2019-07-12 DIAGNOSIS — R69 Illness, unspecified: Secondary | ICD-10-CM | POA: Diagnosis not present

## 2019-07-12 DIAGNOSIS — R0789 Other chest pain: Secondary | ICD-10-CM | POA: Diagnosis not present

## 2019-07-27 ENCOUNTER — Ambulatory Visit: Payer: Medicare HMO | Attending: Internal Medicine

## 2019-07-27 ENCOUNTER — Ambulatory Visit: Payer: Medicare HMO

## 2019-07-27 DIAGNOSIS — Z23 Encounter for immunization: Secondary | ICD-10-CM

## 2019-07-27 NOTE — Progress Notes (Signed)
   Covid-19 Vaccination Clinic  Name:  NYHEEM MADURO    MRN: [id number] DOB: April 06, 1946  07/27/2019  Mr. Wragg was observed post Covid-19 immunization for 15 minutes without incident. He was provided with Vaccine Information Sheet and instruction to access the V-Safe system.   Mr. Wisner was instructed to call 911 with any severe reactions post vaccine: Marland Kitchen Difficulty breathing  . Swelling of face and throat  . A fast heartbeat  . A bad rash all over body  . Dizziness and weakness

## 2019-07-31 ENCOUNTER — Ambulatory Visit: Payer: Medicare HMO | Admitting: Cardiology

## 2019-08-02 ENCOUNTER — Other Ambulatory Visit: Payer: Self-pay

## 2019-08-02 ENCOUNTER — Ambulatory Visit: Payer: Medicare HMO | Admitting: Cardiology

## 2019-08-02 ENCOUNTER — Encounter: Payer: Self-pay | Admitting: Cardiology

## 2019-08-02 VITALS — BP 135/80 | HR 76 | Temp 97.3°F | Ht 68.0 in | Wt 186.4 lb

## 2019-08-02 DIAGNOSIS — R079 Chest pain, unspecified: Secondary | ICD-10-CM

## 2019-08-02 DIAGNOSIS — R9431 Abnormal electrocardiogram [ECG] [EKG]: Secondary | ICD-10-CM

## 2019-08-02 DIAGNOSIS — I10 Essential (primary) hypertension: Secondary | ICD-10-CM | POA: Diagnosis not present

## 2019-08-02 DIAGNOSIS — E785 Hyperlipidemia, unspecified: Secondary | ICD-10-CM | POA: Diagnosis not present

## 2019-08-02 NOTE — Patient Instructions (Addendum)
Medication Instructions:  Your physician recommends that you continue on your current medications as directed. Please refer to the Current Medication list given to you today.  Lab Work: NONE  Testing/Procedures: Your physician has requested that you have an echocardiogram. Echocardiography is a painless test that uses sound waves to create images of your heart. It provides your doctor with information about the size and shape of your heart and how well your heart's chambers and valves are working. This procedure takes approximately one hour. There are no restrictions for this procedure. This will be done at our [facility] location:  [place]: At [facility], you and your health needs are our priority.  As part of our continuing mission to provide you with exceptional heart care, we have created designated Provider Care Teams.  These Care Teams include your primary Cardiologist (physician) and Advanced Practice Providers (APPs -  Physician Assistants and Nurse Practitioners) who all work together to provide you with the care you need, when you need it.  We recommend signing up for the patient portal called "MyChart".  Sign up information is provided on this After Visit Summary.  MyChart is used to connect with patients for Virtual Visits (Telemedicine).  Patients are able to view lab/test results, encounter notes, upcoming appointments, etc.  Non-urgent messages can be sent to your provider as well.   To learn more about what you can do with MyChart, go to [email or web address].    Your next appointment:   1 month(s)  The format for your next appointment:   Either In Person or Virtual  Provider:   Oswaldo Milian, MD

## 2019-08-02 NOTE — Progress Notes (Signed)
Cardiology Office Note:    Date:  08/02/2019   ID:  Randall Lane, DOB [birth year], MRN [id number]  PCP:  Orpah Melter, MD  Cardiologist:  No primary care provider on file.  Electrophysiologist:  None   Referring MD: Carolee Rota, NP   Chief Complaint  Patient presents with  . Chest Pain    History of Present Illness:    Randall Lane is a 74 y.o. male with a hx of hypertension, hyperlipidemia, prediabetes who is referred by Carolee Rota, NP for evaluation of chest pain.  He reports that about 7 weeks ago he was doing sit ups at the gym.  While doing sit-ups he had the acute onset of upper abdominal pain.  Felt like he was getting punched in his abdomen/lower chest.  Since that time he has had intermittent chest tightness that occurs with certain movements.  Reports tightness in the center of his chest.,  Will last 2 to 3 seconds and resolved.  He normally is very active, goes to the gym at least 2 times per week and rides bicycle and lifts weights, typically spends at least an hour at the gym.  No exertional chest pain.  Denies any smoking history.  Father had PCI in 72s.   Past Medical History:  Diagnosis Date  . Hyperlipidemia   . Hypertension     No past surgical history on file.  Current Medications: Current Meds  Medication Sig  . amLODipine (NORVASC) 5 MG tablet Take 2 tablets (10 mg total) by mouth daily.  Marland Kitchen aspirin EC 81 MG tablet Take 81 mg by mouth daily.  Marland Kitchen atorvastatin (LIPITOR) 40 MG tablet Take 40 mg by mouth at bedtime.  . Garlic 10 MG CAPS Take by mouth.  . Hawthorn 150 MG CAPS Take by mouth.  . Multiple Vitamins-Minerals (MENS 50+ MULTI VITAMIN/MIN PO) Take 1 tablet by mouth daily.  . Omega-3 Fatty Acids (FISH OIL) 1000 MG CAPS Take 2,000 mg by mouth daily.     Allergies:   Patient has no known allergies.   Social History   Socioeconomic History  . Marital status: Married    Spouse name: Not on file  . Number of children: Not on file  .  Years of education: Not on file  . Highest education level: Not on file  Occupational History  . Not on file  Tobacco Use  . Smoking status: Never Smoker  . Smokeless tobacco: Never Used  Substance and Sexual Activity  . Alcohol use: Yes  . Drug use: Never  . Sexual activity: Yes  Other Topics Concern  . Not on file  Social History Narrative  . Not on file   Social Determinants of Health   Financial Resource Strain:   . Difficulty of Paying Living Expenses: Not on file  Food Insecurity:   . Worried About Charity fundraiser in the Last Year: Not on file  . Ran Out of Food in the Last Year: Not on file  Transportation Needs:   . Lack of Transportation (Medical): Not on file  . Lack of Transportation (Non-Medical): Not on file  Physical Activity:   . Days of Exercise per Week: Not on file  . Minutes of Exercise per Session: Not on file  Stress:   . Feeling of Stress : Not on file  Social Connections:   . Frequency of Communication with Friends and Family: Not on file  . Frequency of Social Gatherings with Friends and Family: Not on file  .  Attends Religious Services: Not on file  . Active Member of Clubs or Organizations: Not on file  . Attends Archivist Meetings: Not on file  . Marital Status: Not on file     Family History: Father had PCI in 38s  ROS:   Please see the history of present illness.     All other systems reviewed and are negative.  EKGs/Labs/Other Studies Reviewed:    The following studies were reviewed today:   EKG:  EKG is  ordered today.  The ekg ordered today demonstrates normal sinus rhythm, rate 76, sinus arrhythmia: Poor R wave progression  Recent Labs: No results found for requested labs within last 8760 hours.  Recent Lipid Panel No results found for: CHOL, TRIG, HDL, CHOLHDL, VLDL, LDLCALC, LDLDIRECT  Physical Exam:    VS:  BP 135/80   Pulse 76   Temp (!) 97.3 F (36.3 C)   Ht 5\' 8"  (1.727 m)   Wt 186 lb 6.4 oz (84.6  kg)   SpO2 97%   BMI 28.34 kg/m     Wt Readings from Last 3 Encounters:  08/02/19 186 lb 6.4 oz (84.6 kg)  05/10/18 185 lb (83.9 kg)  05/10/18 186 lb (84.4 kg)     GEN:  Well nourished, well developed in no acute distress HEENT: Normal NECK: No JVD CARDIAC: RRR, no murmurs, rubs, gallops RESPIRATORY:  Clear to auscultation without rales, wheezing or rhonchi  ABDOMEN: Soft, non-tender, non-distended MUSCULOSKELETAL:  No edema; No deformity  SKIN: Warm and dry NEUROLOGIC:  Alert and oriented x 3 PSYCHIATRIC:  Normal affect   ASSESSMENT:    1. Abnormal EKG   2. Chest pain of uncertain etiology   3. Essential hypertension   4. Hyperlipidemia, unspecified hyperlipidemia type    PLAN:    Chest pain: Description suggests musculoskeletal pain, as describes chest pain with certain movements that lasts few seconds and resolves.  Has been improving.  Denies any exertional chest pain. -Will hold off on assessment for ischemia at this point.  If chest pain does not resolve or develops any exertional pain, will evaluate further with coronary CTA  Abnormal EKG: Poor R wave progression on EKG, which could be suggestive of old anterior infarct.  Will check TTE  Hypertension: appears controlled, continue amlodipine 10 mg daily  Hyperlipidemia: LDL 55 on 06/21/2018, continue atorvastatin 40 mg daily  RTC in 1 month   Medication Adjustments/Labs and Tests Ordered: Current medicines are reviewed at length with the patient today.  Concerns regarding medicines are outlined above.  Orders Placed This Encounter  Procedures  . EKG 12-Lead  . ECHOCARDIOGRAM COMPLETE   No orders of the defined types were placed in this encounter.   Patient Instructions  Medication Instructions:  Your physician recommends that you continue on your current medications as directed. Please refer to the Current Medication list given to you today.  Lab Work: NONE  Testing/Procedures: Your physician has  requested that you have an echocardiogram. Echocardiography is a painless test that uses sound waves to create images of your heart. It provides your doctor with information about the size and shape of your heart and how well your heart's chambers and valves are working. This procedure takes approximately one hour. There are no restrictions for this procedure. This will be done at our [facility] location:  [place]: At [facility], you and your health needs are our priority.  As part of our continuing mission  to provide you with exceptional heart care, we have created designated Provider Care Teams.  These Care Teams include your primary Cardiologist (physician) and Advanced Practice Providers (APPs -  Physician Assistants and Nurse Practitioners) who all work together to provide you with the care you need, when you need it.  We recommend signing up for the patient portal called "MyChart".  Sign up information is provided on this After Visit Summary.  MyChart is used to connect with patients for Virtual Visits (Telemedicine).  Patients are able to view lab/test results, encounter notes, upcoming appointments, etc.  Non-urgent messages can be sent to your provider as well.   To learn more about what you can do with MyChart, go to [email or web address].    Your next appointment:   1 month(s)  The format for your next appointment:   Either In Person or Virtual  Provider:   Oswaldo Milian, MD        Signed, Donato Heinz, MD  08/02/2019 6:17 PM    [place]

## 2019-08-16 ENCOUNTER — Ambulatory Visit (HOSPITAL_COMMUNITY): Payer: Medicare HMO | Attending: Cardiology

## 2019-08-16 ENCOUNTER — Other Ambulatory Visit: Payer: Self-pay

## 2019-08-16 DIAGNOSIS — R9431 Abnormal electrocardiogram [ECG] [EKG]: Secondary | ICD-10-CM | POA: Diagnosis not present

## 2019-08-23 ENCOUNTER — Ambulatory Visit: Payer: Medicare HMO | Attending: Internal Medicine

## 2019-08-23 DIAGNOSIS — Z23 Encounter for immunization: Secondary | ICD-10-CM

## 2019-08-23 NOTE — Progress Notes (Signed)
   Covid-19 Vaccination Clinic  Name:  Randall Lane    MRN: [id number] DOB: 1946-02-28  08/23/2019  Randall Lane was observed post Covid-19 immunization for 15 minutes without incident. He was provided with Vaccine Information Sheet and instruction to access the V-Safe system.   Randall Lane was instructed to call 911 with any severe reactions post vaccine: Marland Kitchen Difficulty breathing  . Swelling of face and throat  . A fast heartbeat  . A bad rash all over body  . Dizziness and weakness   Immunizations Administered    Name Date Dose VIS Date Route   Pfizer COVID-19 Vaccine 08/23/2019  8:20 AM 0.3 mL 05/05/2019 Intramuscular   Manufacturer: [place]   Lot: [id number]   [place]: [id number]

## 2019-09-04 NOTE — Progress Notes (Signed)
Virtual Visit via Telephone Note   This visit type was conducted due to national recommendations for restrictions regarding the COVID-19 Pandemic (e.g. social distancing) in an effort to limit this patient's exposure and mitigate transmission in our community.  Due to his co-morbid illnesses, this patient is at least at moderate risk for complications without adequate follow up.  This format is felt to be most appropriate for this patient at this time.  The patient did not have access to video technology/had technical difficulties with video requiring transitioning to audio format only (telephone).  All issues noted in this document were discussed and addressed.  No physical exam could be performed with this format.  Please refer to the patient's chart for his  consent to telehealth for [facility].   Date:  09/05/2019   ID:  Randall Lane, DOB [birth year], MRN [id number]  Patient Location: Home Provider Location: Office  PCP:  Orpah Melter, MD  Cardiologist:  Donato Heinz, MD  Electrophysiologist:  None   Evaluation Performed:  Follow-Up Visit  Chief Complaint:  Chest pain  History of Present Illness:    Randall Lane is a 74 y.o. male with a hx of hypertension, hyperlipidemia, prediabetes who presents for follow-up.  He was referred by Carolee Rota, NP for evaluation of chest pain, initially seen on 08/02/2019.  He reports that about 7 weeks prior he was doing sit ups at the gym.  While doing sit-ups he had the acute onset of upper abdominal pain.  Felt like he was getting punched in his abdomen/lower chest.  Since that time he has had intermittent chest tightness that occurs with certain movements.  Reports tightness in the center of his chest.,  Will last 2 to 3 seconds and resolved.  He normally is very active, goes to the gym at least 2 times per week and rides bicycle and lifts weights, typically spends at least an hour at the gym.  No exertional chest pain.  Denies  any smoking history.  Father had PCI in 24s.  Given his description of chest pain consistent with musculoskeletal pain, recommended holding off on ischemia assessment at initial visit.  EKG notable for poor R wave progression, TTE was checked which showed no significant abnormalities.  Since last clinic visit, he reports that he has been doing well.  States that chest pain has resolved.  He has been walking at least twice per week for 3 to 3-1/2 miles and also went to the gym last week.  He denies any further chest pain or shortness of breath.   Past Medical History:  Diagnosis Date  . Hyperlipidemia   . Hypertension    No past surgical history on file.   Current Meds  Medication Sig  . amLODipine (NORVASC) 5 MG tablet Take 2 tablets (10 mg total) by mouth daily.  Marland Kitchen atorvastatin (LIPITOR) 40 MG tablet Take 40 mg by mouth at bedtime.  . Garlic 10 MG CAPS Take by mouth.  . Hawthorn 150 MG CAPS Take by mouth.  . Multiple Vitamins-Minerals (MENS 50+ MULTI VITAMIN/MIN PO) Take 1 tablet by mouth daily.  . Omega-3 Fatty Acids (FISH OIL) 1000 MG CAPS Take 2,000 mg by mouth daily.     Allergies:   Patient has no known allergies.   Social History   Tobacco Use  . Smoking status: Never Smoker  . Smokeless tobacco: Never Used  Substance Use Topics  . Alcohol use: Yes  . Drug use: Never  Family Hx: The patient's family history is not on file.  ROS:   Please see the history of present illness.     All other systems reviewed and are negative.   Prior CV studies:   The following studies were reviewed today:  TTE 08/16/19: 1. Left ventricular ejection fraction, by estimation, is 60 to 65%. The  left ventricle has normal function. The left ventricle has no regional  wall motion abnormalities. There is mild left ventricular hypertrophy.  Left ventricular diastolic parameters  are consistent with Grade I diastolic dysfunction (impaired relaxation).  GLS normal -19.5%.  2. Right  ventricular systolic function is normal. The right ventricular  size is normal. There is normal pulmonary artery systolic pressure. The  estimated right ventricular systolic pressure is [id number] mmHg.  3. The aortic valve is tricuspid. Aortic valve regurgitation is not  visualized. Mild aortic valve sclerosis is present, with no evidence of  aortic valve stenosis.  4. The inferior vena cava is normal in size with greater than 50%  respiratory variability, suggesting right atrial pressure of 3 mmHg.  5. The mitral valve is normal in structure. Trivial mitral valve  regurgitation. No evidence of mitral stenosis.   Labs/Other Tests and Data Reviewed:    EKG:  No ECG reviewed.  Recent Labs: No results found for requested labs within last 8760 hours.   Recent Lipid Panel No results found for: CHOL, TRIG, HDL, CHOLHDL, LDLCALC, LDLDIRECT  Wt Readings from Last 3 Encounters:  09/05/19 180 lb (81.6 kg)  08/02/19 186 lb 6.4 oz (84.6 kg)  05/10/18 185 lb (83.9 kg)     Objective:    Vital Signs:  BP 130/75   Pulse 73   Ht 5\' 8"  (1.727 m)   Wt 180 lb (81.6 kg)   BMI 27.37 kg/m    VITAL SIGNS:  reviewed  Gen: no respiratory distress, able to converse comfortably  ASSESSMENT & PLAN:    Chest pain: Description suggests musculoskeletal pain, as describes chest pain with certain movements that lasts few seconds and resolves.  Has resolved.  Denies any exertional chest pain.  No further work-up recommended at this time.  Abnormal EKG: Poor R wave progression on EKG. No evidence of prior MI on TTE 08/12/2019  Hypertension: appears controlled, continue amlodipine 10 mg daily  Hyperlipidemia: LDL 55 on 06/21/2018, continue atorvastatin 40 mg daily  RTC in 1 year   Time:   Today, I have spent 10 minutes with the patient with telehealth technology discussing the above problems.     Medication Adjustments/Labs and Tests Ordered: Current medicines are reviewed at length with the  patient today.  Concerns regarding medicines are outlined above.   Tests Ordered: No orders of the defined types were placed in this encounter.   Medication Changes: No orders of the defined types were placed in this encounter.   Follow Up:  In Person in 1 year(s)  Signed, Donato Heinz, MD  09/05/2019 9:04 AM    [facility] Last week

## 2019-09-05 ENCOUNTER — Telehealth (INDEPENDENT_AMBULATORY_CARE_PROVIDER_SITE_OTHER): Payer: Medicare HMO | Admitting: Cardiology

## 2019-09-05 ENCOUNTER — Encounter: Payer: Self-pay | Admitting: Cardiology

## 2019-09-05 VITALS — BP 130/75 | HR 73 | Ht 68.0 in | Wt 180.0 lb

## 2019-09-05 DIAGNOSIS — E785 Hyperlipidemia, unspecified: Secondary | ICD-10-CM | POA: Diagnosis not present

## 2019-09-05 DIAGNOSIS — I10 Essential (primary) hypertension: Secondary | ICD-10-CM

## 2019-09-05 DIAGNOSIS — R079 Chest pain, unspecified: Secondary | ICD-10-CM

## 2019-09-05 DIAGNOSIS — R9431 Abnormal electrocardiogram [ECG] [EKG]: Secondary | ICD-10-CM | POA: Diagnosis not present

## 2019-09-05 NOTE — Patient Instructions (Signed)
Medication Instructions:  Your physician recommends that you continue on your current medications as directed. Please refer to the Current Medication list given to you today.  *If you need a refill on your cardiac medications before your next appointment, please call your pharmacy*   Lab Work: NONE  Testing/Procedures: NONE  Follow-Up: At [facility], you and your health needs are our priority.  As part of our continuing mission to provide you with exceptional heart care, we have created designated Provider Care Teams.  These Care Teams include your primary Cardiologist (physician) and Advanced Practice Providers (APPs -  Physician Assistants and Nurse Practitioners) who all work together to provide you with the care you need, when you need it.  We recommend signing up for the patient portal called "MyChart".  Sign up information is provided on this After Visit Summary.  MyChart is used to connect with patients for Virtual Visits (Telemedicine).  Patients are able to view lab/test results, encounter notes, upcoming appointments, etc.  Non-urgent messages can be sent to your provider as well.   To learn more about what you can do with MyChart, go to [email or web address].    Your next appointment:   12 month(s)  The format for your next appointment:   In Person  Provider:   Christopher Schumann, MD      

## 2020-01-03 DIAGNOSIS — R69 Illness, unspecified: Secondary | ICD-10-CM | POA: Diagnosis not present

## 2020-01-03 DIAGNOSIS — I10 Essential (primary) hypertension: Secondary | ICD-10-CM | POA: Diagnosis not present

## 2020-01-15 DIAGNOSIS — Z0000 Encounter for general adult medical examination without abnormal findings: Secondary | ICD-10-CM | POA: Diagnosis not present

## 2020-01-15 DIAGNOSIS — I10 Essential (primary) hypertension: Secondary | ICD-10-CM | POA: Diagnosis not present

## 2020-01-15 DIAGNOSIS — E785 Hyperlipidemia, unspecified: Secondary | ICD-10-CM | POA: Diagnosis not present

## 2020-01-15 DIAGNOSIS — N183 Chronic kidney disease, stage 3 unspecified: Secondary | ICD-10-CM | POA: Diagnosis not present

## 2020-01-23 DIAGNOSIS — R69 Illness, unspecified: Secondary | ICD-10-CM | POA: Diagnosis not present

## 2020-01-23 DIAGNOSIS — I10 Essential (primary) hypertension: Secondary | ICD-10-CM | POA: Diagnosis not present

## 2020-04-29 DIAGNOSIS — R69 Illness, unspecified: Secondary | ICD-10-CM | POA: Diagnosis not present

## 2020-05-01 DIAGNOSIS — R202 Paresthesia of skin: Secondary | ICD-10-CM | POA: Diagnosis not present

## 2020-06-06 DIAGNOSIS — L820 Inflamed seborrheic keratosis: Secondary | ICD-10-CM | POA: Diagnosis not present

## 2020-06-06 DIAGNOSIS — L578 Other skin changes due to chronic exposure to nonionizing radiation: Secondary | ICD-10-CM | POA: Diagnosis not present

## 2020-06-06 DIAGNOSIS — D225 Melanocytic nevi of trunk: Secondary | ICD-10-CM | POA: Diagnosis not present

## 2020-06-06 DIAGNOSIS — L570 Actinic keratosis: Secondary | ICD-10-CM | POA: Diagnosis not present

## 2020-06-06 DIAGNOSIS — L918 Other hypertrophic disorders of the skin: Secondary | ICD-10-CM | POA: Diagnosis not present

## 2020-06-19 DIAGNOSIS — H524 Presbyopia: Secondary | ICD-10-CM | POA: Diagnosis not present

## 2020-07-15 DIAGNOSIS — E785 Hyperlipidemia, unspecified: Secondary | ICD-10-CM | POA: Diagnosis not present

## 2020-07-15 DIAGNOSIS — I10 Essential (primary) hypertension: Secondary | ICD-10-CM | POA: Diagnosis not present

## 2020-07-15 DIAGNOSIS — R202 Paresthesia of skin: Secondary | ICD-10-CM | POA: Diagnosis not present

## 2020-07-31 DIAGNOSIS — M9901 Segmental and somatic dysfunction of cervical region: Secondary | ICD-10-CM | POA: Diagnosis not present

## 2020-07-31 DIAGNOSIS — M9903 Segmental and somatic dysfunction of lumbar region: Secondary | ICD-10-CM | POA: Diagnosis not present

## 2020-07-31 DIAGNOSIS — M5411 Radiculopathy, occipito-atlanto-axial region: Secondary | ICD-10-CM | POA: Diagnosis not present

## 2020-08-01 DIAGNOSIS — M5411 Radiculopathy, occipito-atlanto-axial region: Secondary | ICD-10-CM | POA: Diagnosis not present

## 2020-08-01 DIAGNOSIS — M9903 Segmental and somatic dysfunction of lumbar region: Secondary | ICD-10-CM | POA: Diagnosis not present

## 2020-08-01 DIAGNOSIS — M9901 Segmental and somatic dysfunction of cervical region: Secondary | ICD-10-CM | POA: Diagnosis not present

## 2020-08-05 DIAGNOSIS — M9903 Segmental and somatic dysfunction of lumbar region: Secondary | ICD-10-CM | POA: Diagnosis not present

## 2020-08-05 DIAGNOSIS — M5411 Radiculopathy, occipito-atlanto-axial region: Secondary | ICD-10-CM | POA: Diagnosis not present

## 2020-08-05 DIAGNOSIS — M9901 Segmental and somatic dysfunction of cervical region: Secondary | ICD-10-CM | POA: Diagnosis not present

## 2020-08-07 DIAGNOSIS — M9903 Segmental and somatic dysfunction of lumbar region: Secondary | ICD-10-CM | POA: Diagnosis not present

## 2020-08-07 DIAGNOSIS — M9901 Segmental and somatic dysfunction of cervical region: Secondary | ICD-10-CM | POA: Diagnosis not present

## 2020-08-07 DIAGNOSIS — M5411 Radiculopathy, occipito-atlanto-axial region: Secondary | ICD-10-CM | POA: Diagnosis not present

## 2020-08-12 DIAGNOSIS — M5411 Radiculopathy, occipito-atlanto-axial region: Secondary | ICD-10-CM | POA: Diagnosis not present

## 2020-08-12 DIAGNOSIS — M9901 Segmental and somatic dysfunction of cervical region: Secondary | ICD-10-CM | POA: Diagnosis not present

## 2020-08-12 DIAGNOSIS — M9903 Segmental and somatic dysfunction of lumbar region: Secondary | ICD-10-CM | POA: Diagnosis not present

## 2020-08-15 DIAGNOSIS — M9903 Segmental and somatic dysfunction of lumbar region: Secondary | ICD-10-CM | POA: Diagnosis not present

## 2020-08-15 DIAGNOSIS — M5411 Radiculopathy, occipito-atlanto-axial region: Secondary | ICD-10-CM | POA: Diagnosis not present

## 2020-08-15 DIAGNOSIS — M9901 Segmental and somatic dysfunction of cervical region: Secondary | ICD-10-CM | POA: Diagnosis not present

## 2020-08-19 DIAGNOSIS — M9901 Segmental and somatic dysfunction of cervical region: Secondary | ICD-10-CM | POA: Diagnosis not present

## 2020-08-19 DIAGNOSIS — M5411 Radiculopathy, occipito-atlanto-axial region: Secondary | ICD-10-CM | POA: Diagnosis not present

## 2020-08-19 DIAGNOSIS — M9903 Segmental and somatic dysfunction of lumbar region: Secondary | ICD-10-CM | POA: Diagnosis not present

## 2020-08-22 DIAGNOSIS — M5411 Radiculopathy, occipito-atlanto-axial region: Secondary | ICD-10-CM | POA: Diagnosis not present

## 2020-08-22 DIAGNOSIS — M9901 Segmental and somatic dysfunction of cervical region: Secondary | ICD-10-CM | POA: Diagnosis not present

## 2020-08-22 DIAGNOSIS — M9903 Segmental and somatic dysfunction of lumbar region: Secondary | ICD-10-CM | POA: Diagnosis not present

## 2020-08-26 DIAGNOSIS — M9901 Segmental and somatic dysfunction of cervical region: Secondary | ICD-10-CM | POA: Diagnosis not present

## 2020-08-26 DIAGNOSIS — M5411 Radiculopathy, occipito-atlanto-axial region: Secondary | ICD-10-CM | POA: Diagnosis not present

## 2020-08-26 DIAGNOSIS — M9903 Segmental and somatic dysfunction of lumbar region: Secondary | ICD-10-CM | POA: Diagnosis not present

## 2020-08-29 DIAGNOSIS — M5411 Radiculopathy, occipito-atlanto-axial region: Secondary | ICD-10-CM | POA: Diagnosis not present

## 2020-08-29 DIAGNOSIS — M9901 Segmental and somatic dysfunction of cervical region: Secondary | ICD-10-CM | POA: Diagnosis not present

## 2020-08-29 DIAGNOSIS — M9903 Segmental and somatic dysfunction of lumbar region: Secondary | ICD-10-CM | POA: Diagnosis not present

## 2020-09-02 DIAGNOSIS — M9903 Segmental and somatic dysfunction of lumbar region: Secondary | ICD-10-CM | POA: Diagnosis not present

## 2020-09-02 DIAGNOSIS — M9901 Segmental and somatic dysfunction of cervical region: Secondary | ICD-10-CM | POA: Diagnosis not present

## 2020-09-02 DIAGNOSIS — M5411 Radiculopathy, occipito-atlanto-axial region: Secondary | ICD-10-CM | POA: Diagnosis not present

## 2020-09-09 DIAGNOSIS — M9903 Segmental and somatic dysfunction of lumbar region: Secondary | ICD-10-CM | POA: Diagnosis not present

## 2020-09-09 DIAGNOSIS — M9901 Segmental and somatic dysfunction of cervical region: Secondary | ICD-10-CM | POA: Diagnosis not present

## 2020-09-09 DIAGNOSIS — M5411 Radiculopathy, occipito-atlanto-axial region: Secondary | ICD-10-CM | POA: Diagnosis not present

## 2020-09-19 ENCOUNTER — Ambulatory Visit: Payer: Medicare HMO | Admitting: Cardiology

## 2020-10-07 DIAGNOSIS — M9901 Segmental and somatic dysfunction of cervical region: Secondary | ICD-10-CM | POA: Diagnosis not present

## 2020-10-07 DIAGNOSIS — M5411 Radiculopathy, occipito-atlanto-axial region: Secondary | ICD-10-CM | POA: Diagnosis not present

## 2020-10-07 DIAGNOSIS — M9903 Segmental and somatic dysfunction of lumbar region: Secondary | ICD-10-CM | POA: Diagnosis not present

## 2020-10-21 NOTE — Progress Notes (Deleted)
Cardiology Office Note:    Date:  10/21/2020   ID:  LANKFORD GUTZMER, DOB 08-03-1945, MRN [id number]  PCP:  Orpah Melter, MD  Cardiologist:  Donato Heinz, MD  Electrophysiologist:  None   Referring MD: Orpah Melter, MD   No chief complaint on file.   History of Present Illness:    Randall Lane is a 75 y.o. male with a hx of hypertension, hyperlipidemia, prediabetes who presents for follow-up.  He was referred by Carolee Rota, NP for evaluation of chest pain, initially seen on 08/02/2019.  He reports that about 7 weeks prior he was doing sit ups at the gym.  While doing sit-ups he had the acute onset of upper abdominal pain.  Felt like he was getting punched in his abdomen/lower chest.  Since that time he has had intermittent chest tightness that occurs with certain movements.  Reports tightness in the center of his chest.,  Will last 2 to 3 seconds and resolved.  He normally is very active, goes to the gym at least 2 times per week and rides bicycle and lifts weights, typically spends at least an hour at the gym.  No exertional chest pain.  Denies any smoking history.  Father had PCI in 19s.  Given his description of chest pain consistent with musculoskeletal pain, recommended holding off on ischemia assessment at initial visit.  EKG notable for poor R wave progression, TTE was checked which showed no significant abnormalities.  Since last clinic visit,  he reports that he has been doing well.  States that chest pain has resolved.  He has been walking at least twice per week for 3 to 3-1/2 miles and also went to the gym last week.  He denies any further chest pain or shortness of breath.   Past Medical History:  Diagnosis Date  . Hyperlipidemia   . Hypertension     No past surgical history on file.  Current Medications: No outpatient medications have been marked as taking for the 10/22/20 encounter (Appointment) with Donato Heinz, MD.     Allergies:    Patient has no known allergies.   Social History   Socioeconomic History  . Marital status: Married    Spouse name: Not on file  . Number of children: Not on file  . Years of education: Not on file  . Highest education level: Not on file  Occupational History  . Not on file  Tobacco Use  . Smoking status: Never Smoker  . Smokeless tobacco: Never Used  Vaping Use  . Vaping Use: Never used  Substance and Sexual Activity  . Alcohol use: Yes  . Drug use: Never  . Sexual activity: Yes  Other Topics Concern  . Not on file  Social History Narrative  . Not on file   Social Determinants of Health   Financial Resource Strain: Not on file  Food Insecurity: Not on file  Transportation Needs: Not on file  Physical Activity: Not on file  Stress: Not on file  Social Connections: Not on file     Family History: Father had PCI in 6s  ROS:   Please see the history of present illness.     All other systems reviewed and are negative.  EKGs/Labs/Other Studies Reviewed:    The following studies were reviewed today:   EKG:  EKG is  ordered today.  The ekg ordered today demonstrates normal sinus rhythm, rate 76, sinus arrhythmia: Poor R wave progression  Recent Labs: No  results found for requested labs within last 8760 hours.  Recent Lipid Panel No results found for: CHOL, TRIG, HDL, CHOLHDL, VLDL, LDLCALC, LDLDIRECT  Physical Exam:    VS:  There were no vitals taken for this visit.    Wt Readings from Last 3 Encounters:  09/05/19 180 lb (81.6 kg)  08/02/19 186 lb 6.4 oz (84.6 kg)  05/10/18 185 lb (83.9 kg)     GEN:  Well nourished, well developed in no acute distress HEENT: Normal NECK: No JVD CARDIAC: RRR, no murmurs, rubs, gallops RESPIRATORY:  Clear to auscultation without rales, wheezing or rhonchi  ABDOMEN: Soft, non-tender, non-distended MUSCULOSKELETAL:  No edema; No deformity  SKIN: Warm and dry NEUROLOGIC:  Alert and oriented x 3 PSYCHIATRIC:  Normal  affect   ASSESSMENT:    No diagnosis found. PLAN:    Chest pain: Description suggests musculoskeletal pain, as describes chest pain with certain movements that lasts few seconds and resolves. Has resolved. Denies any exertional chest pain.  No further work-up recommended at this time.  Abnormal EKG: Poor R wave progression on EKG. No evidence of prior MI on TTE 08/12/2019  Hypertension: appears controlled, continue amlodipine10 mg daily  Hyperlipidemia: LDL 88on 01/15/20, continue atorvastatin40 mg daily  RTC in 1 year  Medication Adjustments/Labs and Tests Ordered: Current medicines are reviewed at length with the patient today.  Concerns regarding medicines are outlined above.  No orders of the defined types were placed in this encounter.  No orders of the defined types were placed in this encounter.   There are no Patient Instructions on file for this visit.   Signed, Donato Heinz, MD  10/21/2020 10:54 AM    [place]

## 2020-10-22 ENCOUNTER — Other Ambulatory Visit: Payer: Self-pay

## 2020-10-22 ENCOUNTER — Ambulatory Visit: Payer: Medicare HMO | Admitting: Cardiology

## 2020-10-22 VITALS — BP 140/90 | HR 63 | Ht 68.0 in | Wt 184.2 lb

## 2020-10-22 DIAGNOSIS — R079 Chest pain, unspecified: Secondary | ICD-10-CM

## 2020-10-22 DIAGNOSIS — I10 Essential (primary) hypertension: Secondary | ICD-10-CM | POA: Diagnosis not present

## 2020-10-22 DIAGNOSIS — E785 Hyperlipidemia, unspecified: Secondary | ICD-10-CM | POA: Diagnosis not present

## 2020-10-22 DIAGNOSIS — R9431 Abnormal electrocardiogram [ECG] [EKG]: Secondary | ICD-10-CM | POA: Diagnosis not present

## 2020-10-22 MED ORDER — AMLODIPINE BESYLATE 5 MG PO TABS
5.0000 mg | ORAL_TABLET | Freq: Two times a day (BID) | ORAL | 3 refills | Status: DC
Start: 2020-10-22 — End: 2022-01-21

## 2020-10-22 NOTE — Patient Instructions (Signed)
Medication Instructions:  Change amlodipine to 5 mg two times daily  Please check your blood pressure at home two times daily, write it down.  Call the office or send message via Mychart with the readings in 1-2 weeks for Dr. Gardiner Rhyme to review.   *If you need a refill on your cardiac medications before your next appointment, please call your pharmacy*  Testing/Procedures: CT coronary calcium score. This test is done at [address]. This is $99 out of pocket.   Coronary CalciumScan A coronary calcium scan is an imaging test used to look for deposits of calcium and other fatty materials (plaques) in the inner lining of the blood vessels of the heart (coronary arteries). These deposits of calcium and plaques can partly clog and narrow the coronary arteries without producing any symptoms or warning signs. This puts a person at risk for a heart attack. This test can detect these deposits before symptoms develop. Tell a health care provider about:  Any allergies you have.  All medicines you are taking, including vitamins, herbs, eye drops, creams, and over-the-counter medicines.  Any problems you or family members have had with anesthetic medicines.  Any blood disorders you have.  Any surgeries you have had.  Any medical conditions you have.  Whether you are pregnant or may be pregnant. What are the risks? Generally, this is a safe procedure. However, problems may occur, including:  Harm to a pregnant woman and her unborn baby. This test involves the use of radiation. Radiation exposure can be dangerous to a pregnant woman and her unborn baby. If you are pregnant, you generally should not have this procedure done.  Slight increase in the risk of cancer. This is because of the radiation involved in the test. What happens before the procedure? No preparation is needed for this procedure. What happens during the procedure?  You will undress and remove any jewelry  around your neck or chest.  You will put on a hospital gown.  Sticky electrodes will be placed on your chest. The electrodes will be connected to an electrocardiogram (ECG) machine to record a tracing of the electrical activity of your heart.  A CT scanner will take pictures of your heart. During this time, you will be asked to lie still and hold your breath for 2-3 seconds while a picture of your heart is being taken. The procedure may vary among health care providers and hospitals. What happens after the procedure?  You can get dressed.  You can return to your normal activities.  It is up to you to get the results of your test. Ask your health care provider, or the department that is doing the test, when your results will be ready. Summary  A coronary calcium scan is an imaging test used to look for deposits of calcium and other fatty materials (plaques) in the inner lining of the blood vessels of the heart (coronary arteries).  Generally, this is a safe procedure. Tell your health care provider if you are pregnant or may be pregnant.  No preparation is needed for this procedure.  A CT scanner will take pictures of your heart.  You can return to your normal activities after the scan is done. This information is not intended to replace advice given to you by your health care provider. Make sure you discuss any questions you have with your health care provider. Document Released: 11/07/2007 Document Revised: 03/30/2016 Document Reviewed: 03/30/2016 Elsevier Interactive Patient Education  2017 [employer]   Follow-Up: At [facility], you and your health needs are our priority.  As part of our continuing mission to provide you with exceptional heart care, we have created designated Provider Care Teams.  These Care Teams include your primary Cardiologist (physician) and Advanced Practice Providers (APPs -  Physician Assistants and Nurse Practitioners) who all work together to  provide you with the care you need, when you need it.  We recommend signing up for the patient portal called "MyChart".  Sign up information is provided on this After Visit Summary.  MyChart is used to connect with patients for Virtual Visits (Telemedicine).  Patients are able to view lab/test results, encounter notes, upcoming appointments, etc.  Non-urgent messages can be sent to your provider as well.   To learn more about what you can do with MyChart, go to [email or web address].    Your next appointment:   12 month(s)  The format for your next appointment:   In Person  Provider:   Oswaldo Milian, MD

## 2020-10-22 NOTE — Progress Notes (Signed)
Cardiology Office Note:    Date:  10/24/2020   ID:  Randall Lane, DOB December 14, 1945, MRN [id number]  PCP:  Orpah Melter, MD  Cardiologist:  Donato Heinz, MD  Electrophysiologist:  None   Referring MD: Orpah Melter, MD   Chief Complaint  Patient presents with  . Hypertension    History of Present Illness:    Randall Lane is a 75 y.o. male with a hx of hypertension, hyperlipidemia, prediabetes who presents for follow-up.  He was referred by Carolee Rota, NP for evaluation of chest pain, initially seen on 08/02/2019.  He reports that about 7 weeks prior he was doing sit ups at the gym.  While doing sit-ups he had the acute onset of upper abdominal pain.  Felt like he was getting punched in his abdomen/lower chest.  Since that time he has had intermittent chest tightness that occurs with certain movements.  Reports tightness in the center of his chest.,  Will last 2 to 3 seconds and resolved.  He normally is very active, goes to the gym at least 2 times per week and rides bicycle and lifts weights, typically spends at least an hour at the gym.  No exertional chest pain.  Denies any smoking history.  Father had PCI in 77s.  Given his description of chest pain consistent with musculoskeletal pain, recommended holding off on ischemia assessment at initial visit.  EKG notable for poor R wave progression, TTE was checked which showed no significant abnormalities.   Today, he is doing well. He states that he is maintaining regular exercise 3 times a week at his local gym. His workout routine consists of lifting weights and rope activities. He states that he is no longer experiencing chest pain. He states that he experiences periodic lower extremity edema when he is not properly hydrated and it is a side effect of his Amlodipine medication. He states that before he decreased his dosage of Amloodipine he also was experiencing lightheadedness but since the decrease he is no longer  experiencing these symptoms. He denies any SOB, palpitations, syncope, or chest tightness.      Past Medical History:  Diagnosis Date  . Hyperlipidemia   . Hypertension     No past surgical history on file.  Current Medications: Current Meds  Medication Sig  . atorvastatin (LIPITOR) 40 MG tablet Take 40 mg by mouth at bedtime.  . Garlic 10 MG CAPS Take by mouth.  . Hawthorn 150 MG CAPS Take by mouth.  . Multiple Vitamins-Minerals (MENS 50+ MULTI VITAMIN/MIN PO) Take 1 tablet by mouth daily.  . Omega-3 Fatty Acids (FISH OIL) 1000 MG CAPS Take 2,000 mg by mouth daily.  . [DISCONTINUED] amLODipine (NORVASC) 5 MG tablet Take 2 tablets (10 mg total) by mouth daily. (Patient taking differently: Take 10 mg by mouth daily. Takes one a day)     Allergies:   Patient has no known allergies.   Social History   Socioeconomic History  . Marital status: Married    Spouse name: Not on file  . Number of children: Not on file  . Years of education: Not on file  . Highest education level: Not on file  Occupational History  . Not on file  Tobacco Use  . Smoking status: Never Smoker  . Smokeless tobacco: Never Used  Vaping Use  . Vaping Use: Never used  Substance and Sexual Activity  . Alcohol use: Yes  . Drug use: Never  . Sexual activity: Yes  Other Topics Concern  . Not on file  Social History Narrative  . Not on file   Social Determinants of Health   Financial Resource Strain: Not on file  Food Insecurity: Not on file  Transportation Needs: Not on file  Physical Activity: Not on file  Stress: Not on file  Social Connections: Not on file     Family History: Father had PCI in 51s  ROS:   Please see the history of present illness.     All other systems reviewed and are negative.  EKGs/Labs/Other Studies Reviewed:    The following studies were reviewed today:   EKG:   10/22/2020- The EKG ordered today demonstrates normal sinus rhythm, Rate 63, Q waves in lead III and  AVF, Poor R wave progression 08/02/2019-The EKG ordered demonstrates normal sinus rhythm, rate 76, sinus arrhythmia: Poor R wave progression  Recent Labs: No results found for requested labs within last 8760 hours.  Recent Lipid Panel No results found for: CHOL, TRIG, HDL, CHOLHDL, VLDL, LDLCALC, LDLDIRECT  Physical Exam:    VS:  BP 140/90   Pulse 63   Ht 5\' 8"  (1.727 m)   Wt 184 lb 3.2 oz (83.6 kg)   BMI 28.01 kg/m     Wt Readings from Last 3 Encounters:  10/22/20 184 lb 3.2 oz (83.6 kg)  09/05/19 180 lb (81.6 kg)  08/02/19 186 lb 6.4 oz (84.6 kg)     GEN:  Well nourished, well developed in no acute distress HEENT: Normal NECK: No JVD CARDIAC: RRR, no murmurs, rubs, gallops RESPIRATORY:  Clear to auscultation without rales, wheezing or rhonchi  ABDOMEN: Soft, non-tender, non-distended MUSCULOSKELETAL:  No edema; No deformity  SKIN: Warm and dry NEUROLOGIC:  Alert and oriented x 3 PSYCHIATRIC:  Normal affect   ASSESSMENT:    1. Essential hypertension   2. Abnormal EKG   3. Hyperlipidemia, unspecified hyperlipidemia type   4. Chest pain of uncertain etiology    PLAN:    Chest pain: Description suggests musculoskeletal pain, as describes chest pain with certain movements that lasts few seconds and resolves. Has resolved. Denies any exertional chest pain.  No further work-up recommended at this time.  Abnormal EKG: Poor R wave progression on EKG. No evidence of prior MI on TTE 08/12/2019  Hypertension: BP elevated, will increase amlodipine to 5 mg BID.  Asked patient to check BP twice daily and call with results.  Hyperlipidemia: LDL 88 on 12/2019 on atorvastatin40 mg daily.  Will check calcium score to guide how aggressive to be in lowering cholesterol  RTC in 1 year  Medication Adjustments/Labs and Tests Ordered: Current medicines are reviewed at length with the patient today.  Concerns regarding medicines are outlined above.  Orders Placed This Encounter   Procedures  . CT CARDIAC SCORING (SELF PAY ONLY)  . EKG 12-Lead   Meds ordered this encounter  Medications  . amLODipine (NORVASC) 5 MG tablet    Sig: Take 1 tablet (5 mg total) by mouth in the morning and at bedtime.    Dispense:  180 tablet    Refill:  3    Patient Instructions  Medication Instructions:  Change amlodipine to 5 mg two times daily  Please check your blood pressure at home two times daily, write it down.  Call the office or send message via Mychart with the readings in 1-2 weeks for Dr. Gardiner Rhyme to review.   *If you need a refill on your cardiac medications before your next appointment,  please call your pharmacy*  Testing/Procedures: CT coronary calcium score. This test is done at [address]. This is $99 out of pocket.   Coronary CalciumScan A coronary calcium scan is an imaging test used to look for deposits of calcium and other fatty materials (plaques) in the inner lining of the blood vessels of the heart (coronary arteries). These deposits of calcium and plaques can partly clog and narrow the coronary arteries without producing any symptoms or warning signs. This puts a person at risk for a heart attack. This test can detect these deposits before symptoms develop. Tell a health care provider about:  Any allergies you have.  All medicines you are taking, including vitamins, herbs, eye drops, creams, and over-the-counter medicines.  Any problems you or family members have had with anesthetic medicines.  Any blood disorders you have.  Any surgeries you have had.  Any medical conditions you have.  Whether you are pregnant or may be pregnant. What are the risks? Generally, this is a safe procedure. However, problems may occur, including:  Harm to a pregnant woman and her unborn baby. This test involves the use of radiation. Radiation exposure can be dangerous to a pregnant woman and her unborn baby. If you are pregnant, you generally  should not have this procedure done.  Slight increase in the risk of cancer. This is because of the radiation involved in the test. What happens before the procedure? No preparation is needed for this procedure. What happens during the procedure?  You will undress and remove any jewelry around your neck or chest.  You will put on a hospital gown.  Sticky electrodes will be placed on your chest. The electrodes will be connected to an electrocardiogram (ECG) machine to record a tracing of the electrical activity of your heart.  A CT scanner will take pictures of your heart. During this time, you will be asked to lie still and hold your breath for 2-3 seconds while a picture of your heart is being taken. The procedure may vary among health care providers and hospitals. What happens after the procedure?  You can get dressed.  You can return to your normal activities.  It is up to you to get the results of your test. Ask your health care provider, or the department that is doing the test, when your results will be ready. Summary  A coronary calcium scan is an imaging test used to look for deposits of calcium and other fatty materials (plaques) in the inner lining of the blood vessels of the heart (coronary arteries).  Generally, this is a safe procedure. Tell your health care provider if you are pregnant or may be pregnant.  No preparation is needed for this procedure.  A CT scanner will take pictures of your heart.  You can return to your normal activities after the scan is done. This information is not intended to replace advice given to you by your health care provider. Make sure you discuss any questions you have with your health care provider. Document Released: 11/07/2007 Document Revised: 03/30/2016 Document Reviewed: 03/30/2016 Elsevier Interactive Patient Education  2017 [place]: At [facility], you and your health needs are our priority.  As part of  our continuing mission to provide you with exceptional heart care, we have created designated Provider Care Teams.  These Care Teams include your primary Cardiologist (physician) and Advanced Practice Providers (APPs -  Physician Assistants and Nurse Practitioners) who all  work together to provide you with the care you need, when you need it.  We recommend signing up for the patient portal called "MyChart".  Sign up information is provided on this After Visit Summary.  MyChart is used to connect with patients for Virtual Visits (Telemedicine).  Patients are able to view lab/test results, encounter notes, upcoming appointments, etc.  Non-urgent messages can be sent to your provider as well.   To learn more about what you can do with MyChart, go to [email or web address].    Your next appointment:   12 month(s)  The format for your next appointment:   In Person  Provider:   Oswaldo Milian, MD         Ella Bodo as a scribe for Donato Heinz, MD.,have documented all relevant documentation on the behalf of Donato Heinz, MD,as directed by  Donato Heinz, MD while in the presence of Donato Heinz, MD.  I, Donato Heinz, MD, have reviewed all documentation for this visit. The documentation on 10/24/20 for the exam, diagnosis, procedures, and orders are all accurate and complete.    Signed, Donato Heinz, MD  10/24/2020 12:01 AM    [place]

## 2020-11-04 DIAGNOSIS — M9903 Segmental and somatic dysfunction of lumbar region: Secondary | ICD-10-CM | POA: Diagnosis not present

## 2020-11-04 DIAGNOSIS — M5411 Radiculopathy, occipito-atlanto-axial region: Secondary | ICD-10-CM | POA: Diagnosis not present

## 2020-11-04 DIAGNOSIS — M9901 Segmental and somatic dysfunction of cervical region: Secondary | ICD-10-CM | POA: Diagnosis not present

## 2020-11-10 ENCOUNTER — Emergency Department (HOSPITAL_BASED_OUTPATIENT_CLINIC_OR_DEPARTMENT_OTHER): Payer: Medicare HMO

## 2020-11-10 ENCOUNTER — Other Ambulatory Visit: Payer: Self-pay

## 2020-11-10 ENCOUNTER — Emergency Department (HOSPITAL_BASED_OUTPATIENT_CLINIC_OR_DEPARTMENT_OTHER)
Admission: EM | Admit: 2020-11-10 | Discharge: 2020-11-10 | Disposition: A | Discharge status: Home / Self Care | Payer: Medicare HMO | Attending: Emergency Medicine | Admitting: Emergency Medicine

## 2020-11-10 ENCOUNTER — Encounter (HOSPITAL_BASED_OUTPATIENT_CLINIC_OR_DEPARTMENT_OTHER): Payer: Self-pay | Admitting: Emergency Medicine

## 2020-11-10 DIAGNOSIS — Z20822 Contact with and (suspected) exposure to covid-19: Secondary | ICD-10-CM | POA: Insufficient documentation

## 2020-11-10 DIAGNOSIS — I10 Essential (primary) hypertension: Secondary | ICD-10-CM | POA: Insufficient documentation

## 2020-11-10 DIAGNOSIS — R030 Elevated blood-pressure reading, without diagnosis of hypertension: Secondary | ICD-10-CM | POA: Diagnosis present

## 2020-11-10 DIAGNOSIS — Z79899 Other long term (current) drug therapy: Secondary | ICD-10-CM | POA: Diagnosis not present

## 2020-11-10 DIAGNOSIS — R059 Cough, unspecified: Secondary | ICD-10-CM | POA: Insufficient documentation

## 2020-11-10 LAB — CBC WITH DIFFERENTIAL/PLATELET
Abs Immature Granulocytes: 0.02 10*3/uL (ref 0.00–0.07)
Basophils Absolute: 0.1 10*3/uL (ref 0.0–0.1)
Basophils Relative: 1 %
Eosinophils Absolute: 0.1 10*3/uL (ref 0.0–0.5)
Eosinophils Relative: 1 %
HCT: 44.3 % (ref 39.0–52.0)
Hemoglobin: 15.5 g/dL (ref 13.0–17.0)
Immature Granulocytes: 0 %
Lymphocytes Relative: 18 %
Lymphs Abs: 1.6 10*3/uL (ref 0.7–4.0)
MCH: 31 pg (ref 26.0–34.0)
MCHC: 35 g/dL (ref 30.0–36.0)
MCV: 88.6 fL (ref 80.0–100.0)
Monocytes Absolute: 0.7 10*3/uL (ref 0.1–1.0)
Monocytes Relative: 8 %
Neutro Abs: 6.6 10*3/uL (ref 1.7–7.7)
Neutrophils Relative %: 72 %
Platelets: 267 10*3/uL (ref 150–400)
RBC: 5 MIL/uL (ref 4.22–5.81)
RDW: 12.2 % (ref 11.5–15.5)
WBC: 9.1 10*3/uL (ref 4.0–10.5)
nRBC: 0 % (ref 0.0–0.2)

## 2020-11-10 LAB — COMPREHENSIVE METABOLIC PANEL
ALT: 18 U/L (ref 0–44)
AST: 17 U/L (ref 15–41)
Albumin: 4.1 g/dL (ref 3.5–5.0)
Alkaline Phosphatase: 88 U/L (ref 38–126)
Anion gap: 8 (ref 5–15)
BUN: 12 mg/dL (ref 8–23)
CO2: 26 mmol/L (ref 22–32)
Calcium: 8.6 mg/dL — ABNORMAL LOW (ref 8.9–10.3)
Chloride: 105 mmol/L (ref 98–111)
Creatinine, Ser: 1.03 mg/dL (ref 0.61–1.24)
GFR, Estimated: >60 mL/min (ref >60)
Glucose, Bld: 124 mg/dL — ABNORMAL HIGH (ref 70–99)
Potassium: 4.2 mmol/L (ref 3.5–5.1)
Sodium: 139 mmol/L (ref 135–145)
Total Bilirubin: 0.4 mg/dL (ref 0.3–1.2)
Total Protein: 7.3 g/dL (ref 6.5–8.1)

## 2020-11-10 LAB — RESP PANEL BY RT-PCR (FLU A&B, COVID) ARPGX2
Influenza A by PCR: NEGATIVE
Influenza B by PCR: NEGATIVE
SARS Coronavirus 2 by RT PCR: NEGATIVE

## 2020-11-10 NOTE — ED Notes (Signed)
ED Provider at bedside. 

## 2020-11-10 NOTE — ED Provider Notes (Signed)
[name] Provider Note   CSN: [id number] Arrival date & time: 11/10/20  2037     History Chief Complaint  Patient presents with   Hypertension    Randall Lane is a 75 y.o. male.  Patient not feeling well today.  Was checking his blood pressure at home and noted that it was elevated.  He was getting systolic blood pressures around 159.  Did not feel well in the morning.  Just sort of fatigued and a little lethargic his words.  No fevers no chest pain no shortness of breath.  But may have had a little bit of a cough.  No nausea no vomiting.  No headache.  No strokelike symptoms.  Patient does have a history of hypertension.  Is currently treated with Norvasc.  He is currently being followed by cardiology from the [facility] group they are doing a work-up for possible coronary artery disease.      Past Medical History:  Diagnosis Date   Hyperlipidemia    Hypertension     Patient Active Problem List   Diagnosis Date Noted   Hypertensive urgency 05/11/2018   Essential hypertension 05/11/2018   Flushing 05/11/2018   Hyperlipidemia 05/11/2018    History reviewed. No pertinent surgical history.     No family history on file.  Social History   Tobacco Use   Smoking status: Never   Smokeless tobacco: Never  Vaping Use   Vaping Use: Never used  Substance Use Topics   Alcohol use: Yes    Comment: occ   Drug use: Never    Home Medications Prior to Admission medications   Medication Sig Start Date End Date Taking? Authorizing Provider  amLODipine (NORVASC) 5 MG tablet Take 1 tablet (5 mg total) by mouth in the morning and at bedtime. 10/22/20   Donato Heinz, MD  atorvastatin (LIPITOR) 40 MG tablet Take 40 mg by mouth at bedtime.    [provider]  Garlic 10 MG CAPS Take by mouth.    [provider]  Hawthorn 150 MG CAPS Take by mouth.    [provider]  Multiple Vitamins-Minerals (MENS 50+ MULTI  VITAMIN/MIN PO) Take 1 tablet by mouth daily.    [provider]  Omega-3 Fatty Acids (FISH OIL) 1000 MG CAPS Take 2,000 mg by mouth daily.    [provider]    Allergies    Lexapro [escitalopram]  Review of Systems   Review of Systems  Constitutional:  Positive for fatigue. Negative for chills and fever.  HENT:  Negative for ear pain and sore throat.   Eyes:  Negative for pain and visual disturbance.  Respiratory:  Positive for cough. Negative for shortness of breath.   Cardiovascular:  Negative for chest pain, palpitations and leg swelling.  Gastrointestinal:  Negative for abdominal pain and vomiting.  Genitourinary:  Negative for dysuria and hematuria.  Musculoskeletal:  Negative for arthralgias and back pain.  Skin:  Negative for color change and rash.  Neurological:  Negative for seizures and syncope.  All other systems reviewed and are negative.  Physical Exam Updated Vital Signs BP (!) 157/88 (BP Location: Right Arm)   Pulse 67   Temp 98 F (36.7 C) (Oral)   Resp 18   Ht 1.727 m (5\' 8" )   Wt 81.6 kg   SpO2 98%   BMI 27.37 kg/m   Physical Exam Vitals and nursing note reviewed.  Constitutional:      Appearance: Normal appearance.  He is well-developed.  HENT:     Head: Normocephalic and atraumatic.     Mouth/Throat:     Mouth: Mucous membranes are moist.  Eyes:     Extraocular Movements: Extraocular movements intact.     Conjunctiva/sclera: Conjunctivae normal.     Pupils: Pupils are equal, round, and reactive to light.  Cardiovascular:     Rate and Rhythm: Normal rate and regular rhythm.     Heart sounds: No murmur heard. Pulmonary:     Effort: Pulmonary effort is normal. No respiratory distress.     Breath sounds: Normal breath sounds.  Abdominal:     Palpations: Abdomen is soft.     Tenderness: There is no abdominal tenderness.  Musculoskeletal:        General: No swelling. Normal range of motion.     Cervical back: Normal range of  motion and neck supple.  Skin:    General: Skin is warm and dry.     Capillary Refill: Capillary refill takes less than 2 seconds.  Neurological:     General: No focal deficit present.     Mental Status: He is alert and oriented to person, place, and time.     Cranial Nerves: No cranial nerve deficit.     Sensory: No sensory deficit.     Motor: No weakness.    ED Results / Procedures / Treatments   Labs (all labs ordered are listed, but only abnormal results are displayed) Labs Reviewed  COMPREHENSIVE METABOLIC PANEL - Abnormal; Notable for the following components:      Result Value   Glucose, Bld 124 (*)    Calcium 8.6 (*)    All other components within normal limits  RESP PANEL BY RT-PCR (FLU A&B, COVID) ARPGX2  CBC WITH DIFFERENTIAL/PLATELET    EKG EKG Interpretation  Date/Time:  Sunday November 10 2020 21:00:35 EDT Ventricular Rate:  78 PR Interval:  147 QRS Duration: 97 QT Interval:  393 QTC Calculation: 448 R Axis:   41 Text Interpretation: Sinus rhythm Low voltage, precordial leads Confirmed by Fredia Sorrow [phone]) on 11/10/2020 9:07:10 PM  Radiology DG Chest Port 1 View  Result Date: 11/10/2020 CLINICAL DATA:  Hypertension EXAM: PORTABLE CHEST 1 VIEW COMPARISON:  05/10/2018 FINDINGS: The heart size and mediastinal contours are within normal limits. Both lungs are clear. The visualized skeletal structures are unremarkable. IMPRESSION: No active disease. Electronically Signed   By: Inez Catalina M.D.   On: 11/10/2020 22:31    Procedures Procedures   Medications Ordered in ED Medications - No data to display  ED Course  I have reviewed the triage vital signs and the nursing notes.  Pertinent labs & imaging results that were available during my care of the patient were reviewed by me and considered in my medical decision making (see chart for details).    MDM Rules/Calculators/A&P                          Patient's blood pressure here was as high as the  179.  But now getting in the 150 range.'s been consistently elevated.  But he states that the other days his blood pressure has been more in a normal range.  We will have his primary care doctor's cardiology make adjustments on the blood pressure based on a blood pressure log to see what the trend is.  Work-up here tonight CBC is normal electrolytes normal COVID testing influenza testing is negative EKG without any  acute changes.  And chest x-ray has no active disease.  Patient without headache without chest pain without shortness of breath and no strokelike symptoms. Final Clinical Impression(s) / ED Diagnoses Final diagnoses:  Primary hypertension    Rx / DC Orders ED Discharge Orders     None        Fredia Sorrow, MD 11/10/20 2325

## 2020-11-10 NOTE — Discharge Instructions (Addendum)
Keep a blood pressure log at home check your blood pressure daily at 1 time date and time it.  Give your cardiologist call tomorrow about the elevated blood pressure.  And/or your primary care doctor.  Tonight's work-up without any significant abnormalities and labs.  COVID testing influenza testing negative chest x-ray negative EKG without any acute changes.  If your blood pressure trends high like it did tonight you will need some adjustments in your medications.  But this can be done gradually.

## 2020-11-10 NOTE — ED Triage Notes (Addendum)
Pt reports HTN- has been monitoring at home per MD instructions; highest SBP 159; reports not feeling quite right today

## 2020-11-22 ENCOUNTER — Other Ambulatory Visit: Payer: Self-pay

## 2020-11-26 ENCOUNTER — Ambulatory Visit (INDEPENDENT_AMBULATORY_CARE_PROVIDER_SITE_OTHER)
Admission: RE | Admit: 2020-11-26 | Discharge: 2020-11-26 | Disposition: A | Discharge status: Home / Self Care | Payer: Self-pay | Source: Ambulatory Visit | Attending: Cardiology | Admitting: Cardiology

## 2020-11-26 ENCOUNTER — Other Ambulatory Visit: Payer: Self-pay

## 2020-11-26 DIAGNOSIS — I10 Essential (primary) hypertension: Secondary | ICD-10-CM

## 2020-12-02 IMAGING — DX DG CHEST 2V
2 series · 2 of 2 positions shown · non-contrast
Comparison: March 27, 2016

CLINICAL DATA: Chest pain with shortness of breath

EXAM:
CHEST - 2 VIEW

[chest pa]
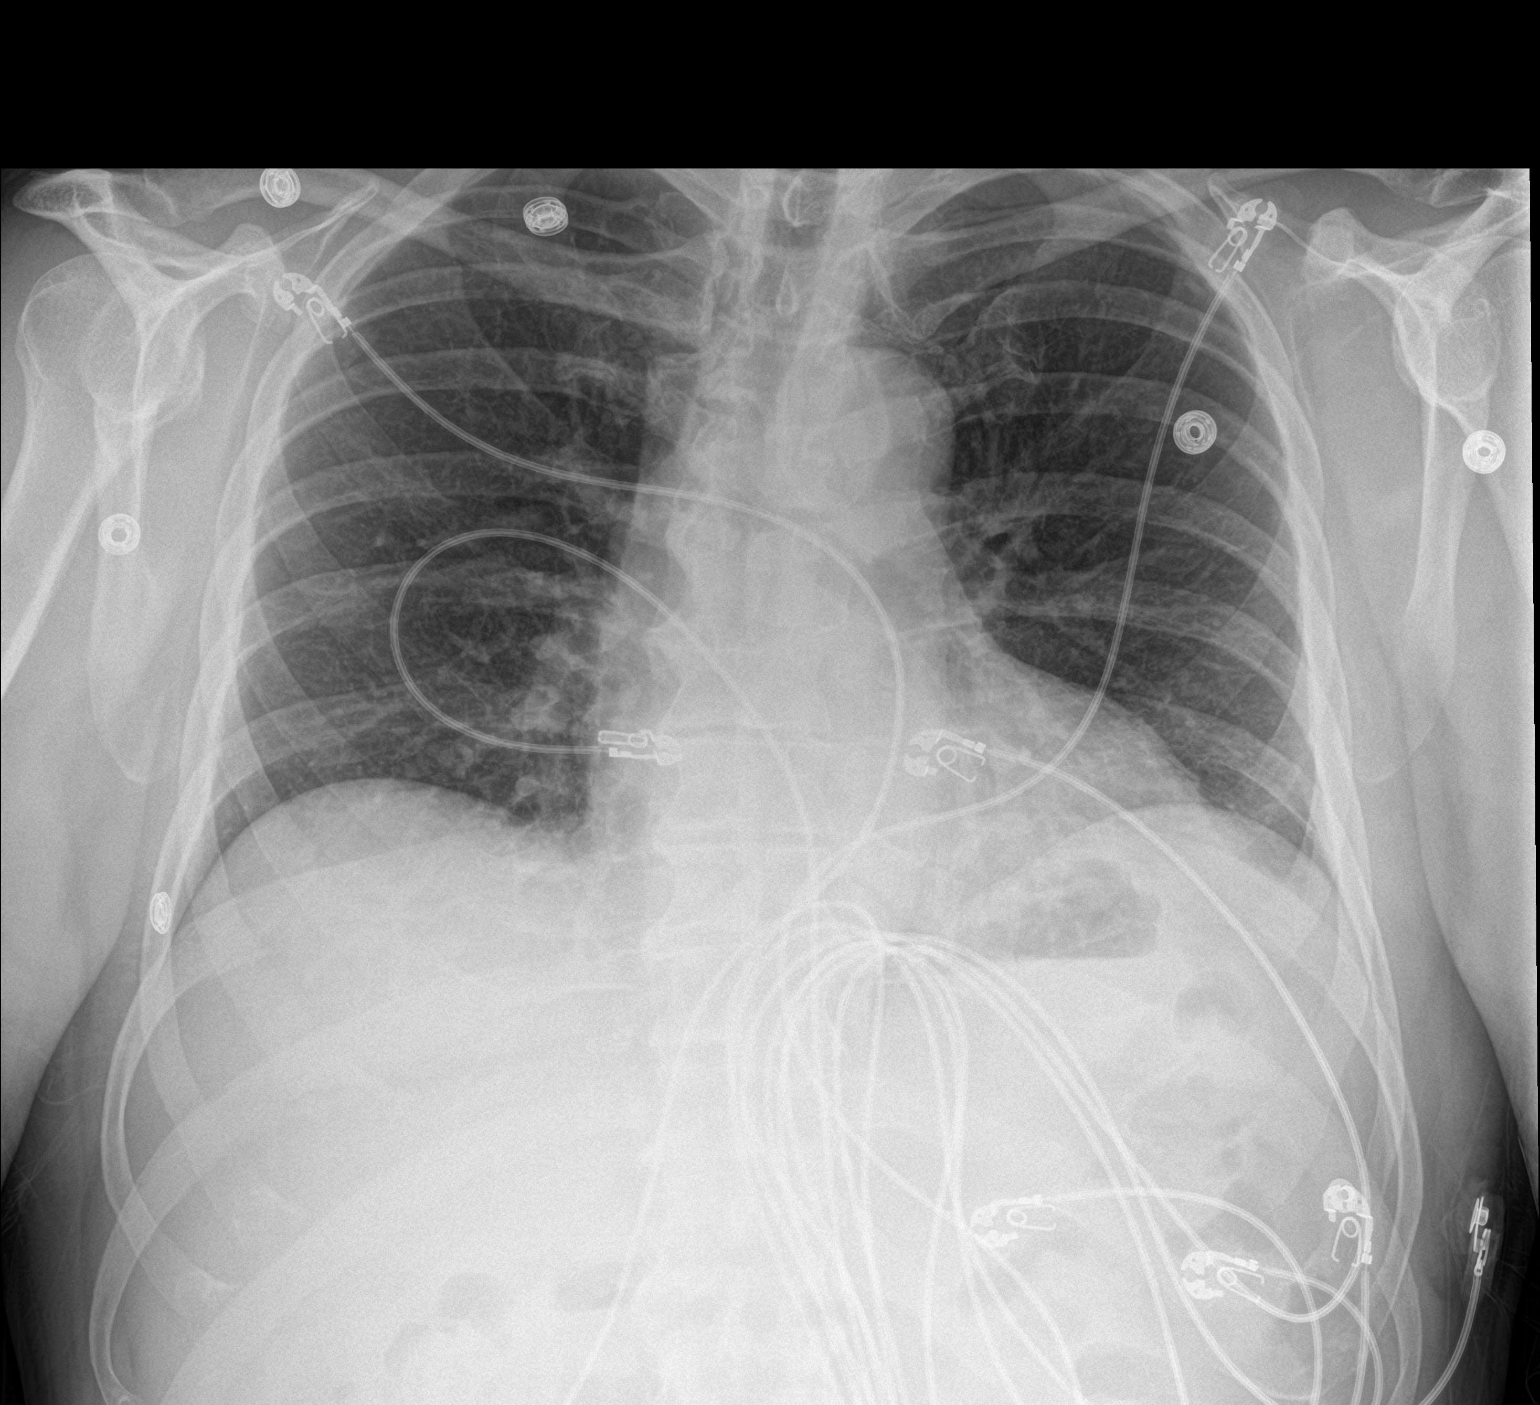

[chest lat]
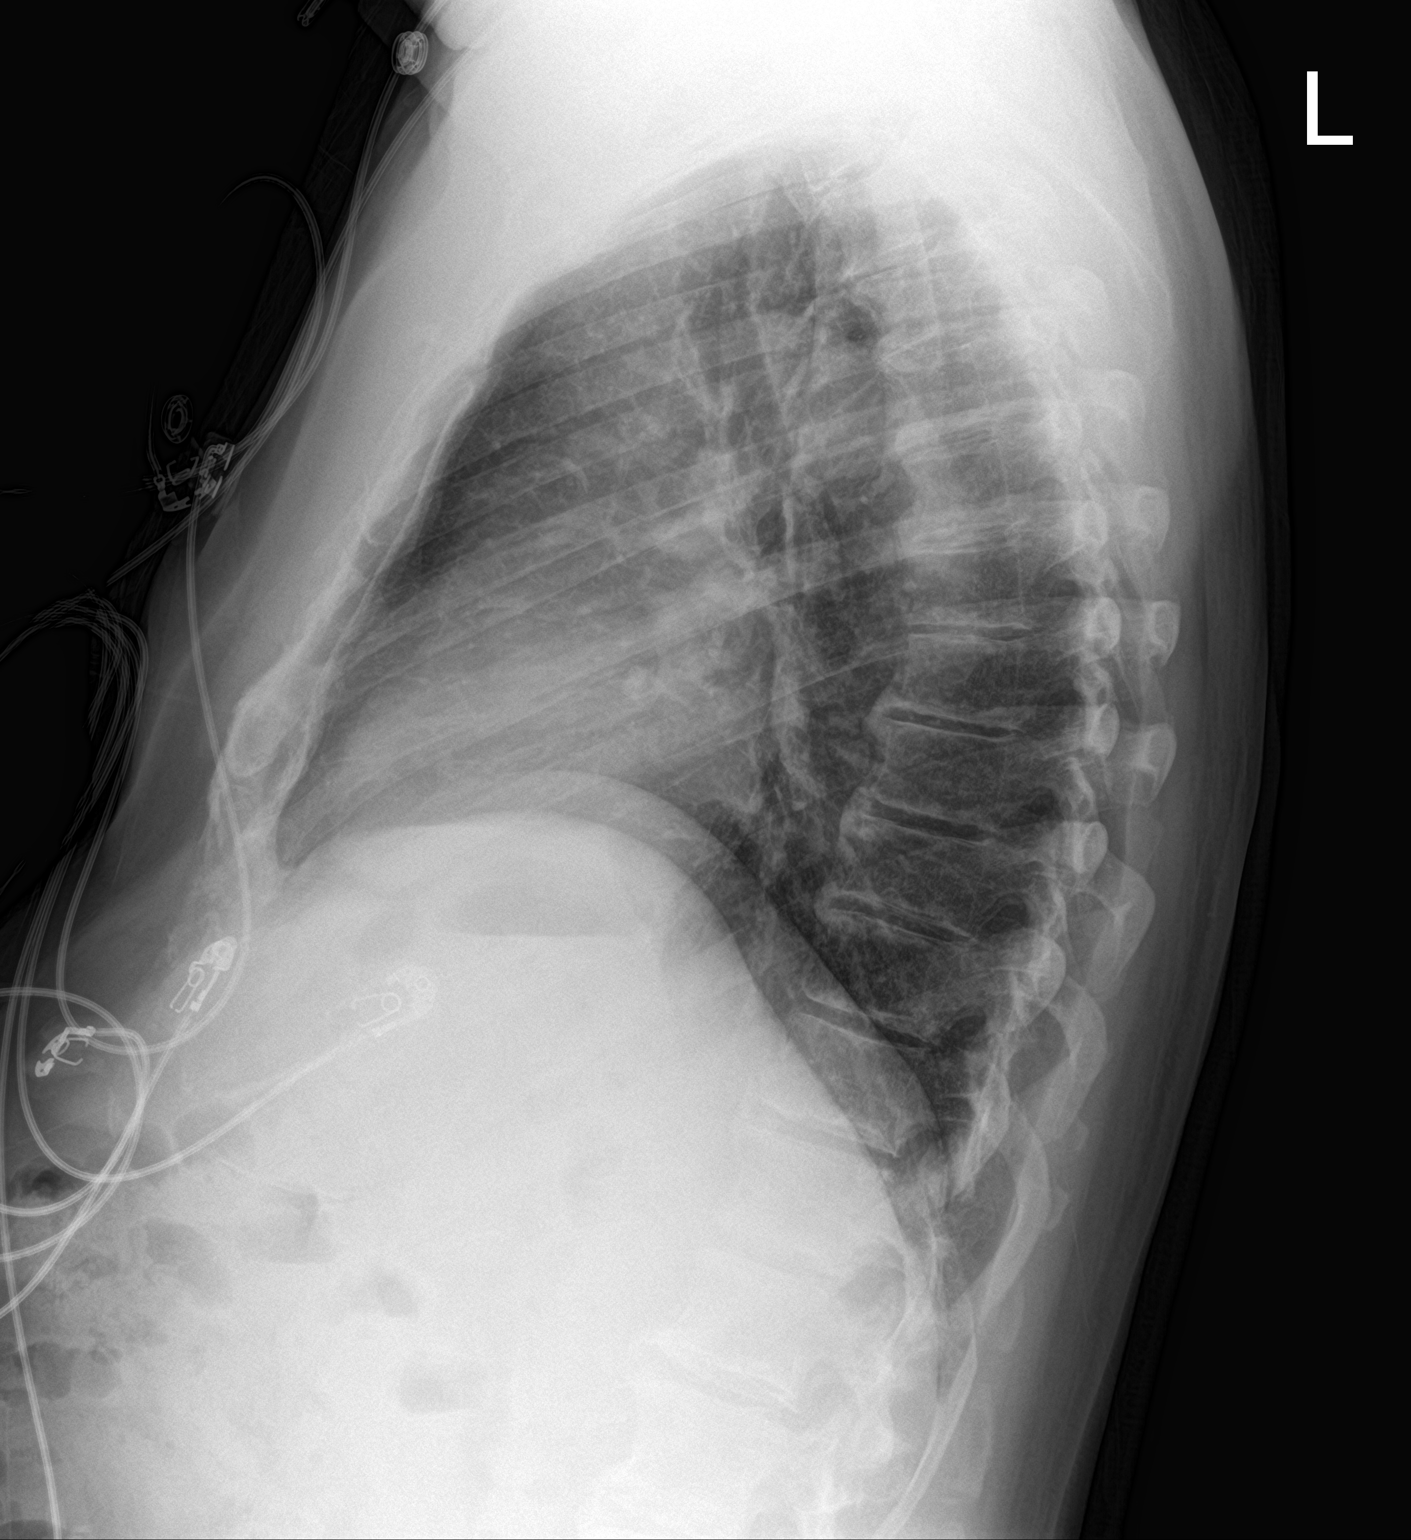

[2 of 2 positions shown; findings below may reference images not displayed]

FINDINGS: There is no evident edema or consolidation. The heart size and
pulmonary vascularity are normal. No adenopathy. There is
degenerative change in the thoracic spine.
IMPRESSION: No edema or consolidation.

## 2020-12-09 DIAGNOSIS — M9901 Segmental and somatic dysfunction of cervical region: Secondary | ICD-10-CM | POA: Diagnosis not present

## 2020-12-09 DIAGNOSIS — M9903 Segmental and somatic dysfunction of lumbar region: Secondary | ICD-10-CM | POA: Diagnosis not present

## 2020-12-09 DIAGNOSIS — M5411 Radiculopathy, occipito-atlanto-axial region: Secondary | ICD-10-CM | POA: Diagnosis not present

## 2020-12-18 ENCOUNTER — Other Ambulatory Visit: Payer: Self-pay

## 2020-12-18 ENCOUNTER — Telehealth: Payer: Self-pay | Admitting: Cardiology

## 2020-12-18 MED ORDER — ATORVASTATIN CALCIUM 80 MG PO TABS: 80.0000 mg | ORAL_TABLET | Freq: Every day | ORAL | 3 refills | Status: DC

## 2020-12-18 NOTE — Telephone Encounter (Signed)
Patient was returning call 

## 2020-12-18 NOTE — Telephone Encounter (Signed)
I attempted to contact patient back to give results.  LVM to call back to discuss.  Will notify primary RN.

## 2020-12-18 NOTE — Telephone Encounter (Signed)
Patient returned call- gave calcium score results.  Advised of increase in medication, medication sent to pharmacy.  Will route to nurse to make aware.  Thanks!

## 2021-01-06 DIAGNOSIS — M9901 Segmental and somatic dysfunction of cervical region: Secondary | ICD-10-CM | POA: Diagnosis not present

## 2021-01-06 DIAGNOSIS — M9903 Segmental and somatic dysfunction of lumbar region: Secondary | ICD-10-CM | POA: Diagnosis not present

## 2021-01-06 DIAGNOSIS — M5411 Radiculopathy, occipito-atlanto-axial region: Secondary | ICD-10-CM | POA: Diagnosis not present

## 2021-01-13 DIAGNOSIS — I700 Atherosclerosis of aorta: Secondary | ICD-10-CM | POA: Diagnosis not present

## 2021-01-13 DIAGNOSIS — I10 Essential (primary) hypertension: Secondary | ICD-10-CM | POA: Diagnosis not present

## 2021-01-13 DIAGNOSIS — Z125 Encounter for screening for malignant neoplasm of prostate: Secondary | ICD-10-CM | POA: Diagnosis not present

## 2021-01-13 DIAGNOSIS — Z0000 Encounter for general adult medical examination without abnormal findings: Secondary | ICD-10-CM | POA: Diagnosis not present

## 2021-01-13 DIAGNOSIS — E785 Hyperlipidemia, unspecified: Secondary | ICD-10-CM | POA: Diagnosis not present

## 2021-01-28 DIAGNOSIS — M9903 Segmental and somatic dysfunction of lumbar region: Secondary | ICD-10-CM | POA: Diagnosis not present

## 2021-01-28 DIAGNOSIS — M9901 Segmental and somatic dysfunction of cervical region: Secondary | ICD-10-CM | POA: Diagnosis not present

## 2021-01-28 DIAGNOSIS — M5411 Radiculopathy, occipito-atlanto-axial region: Secondary | ICD-10-CM | POA: Diagnosis not present

## 2021-02-18 DIAGNOSIS — M5411 Radiculopathy, occipito-atlanto-axial region: Secondary | ICD-10-CM | POA: Diagnosis not present

## 2021-02-18 DIAGNOSIS — M9901 Segmental and somatic dysfunction of cervical region: Secondary | ICD-10-CM | POA: Diagnosis not present

## 2021-02-18 DIAGNOSIS — M9903 Segmental and somatic dysfunction of lumbar region: Secondary | ICD-10-CM | POA: Diagnosis not present

## 2021-03-18 DIAGNOSIS — M5411 Radiculopathy, occipito-atlanto-axial region: Secondary | ICD-10-CM | POA: Diagnosis not present

## 2021-03-18 DIAGNOSIS — M9903 Segmental and somatic dysfunction of lumbar region: Secondary | ICD-10-CM | POA: Diagnosis not present

## 2021-03-18 DIAGNOSIS — M9901 Segmental and somatic dysfunction of cervical region: Secondary | ICD-10-CM | POA: Diagnosis not present

## 2021-03-27 DIAGNOSIS — Z23 Encounter for immunization: Secondary | ICD-10-CM | POA: Diagnosis not present

## 2021-04-15 DIAGNOSIS — M5411 Radiculopathy, occipito-atlanto-axial region: Secondary | ICD-10-CM | POA: Diagnosis not present

## 2021-04-15 DIAGNOSIS — M9901 Segmental and somatic dysfunction of cervical region: Secondary | ICD-10-CM | POA: Diagnosis not present

## 2021-04-15 DIAGNOSIS — M9903 Segmental and somatic dysfunction of lumbar region: Secondary | ICD-10-CM | POA: Diagnosis not present

## 2021-04-24 DIAGNOSIS — M5411 Radiculopathy, occipito-atlanto-axial region: Secondary | ICD-10-CM | POA: Diagnosis not present

## 2021-04-24 DIAGNOSIS — M9901 Segmental and somatic dysfunction of cervical region: Secondary | ICD-10-CM | POA: Diagnosis not present

## 2021-04-24 DIAGNOSIS — M9903 Segmental and somatic dysfunction of lumbar region: Secondary | ICD-10-CM | POA: Diagnosis not present

## 2021-04-28 DIAGNOSIS — M9901 Segmental and somatic dysfunction of cervical region: Secondary | ICD-10-CM | POA: Diagnosis not present

## 2021-04-28 DIAGNOSIS — M9903 Segmental and somatic dysfunction of lumbar region: Secondary | ICD-10-CM | POA: Diagnosis not present

## 2021-04-28 DIAGNOSIS — M5411 Radiculopathy, occipito-atlanto-axial region: Secondary | ICD-10-CM | POA: Diagnosis not present

## 2021-05-13 DIAGNOSIS — M9901 Segmental and somatic dysfunction of cervical region: Secondary | ICD-10-CM | POA: Diagnosis not present

## 2021-05-13 DIAGNOSIS — M5411 Radiculopathy, occipito-atlanto-axial region: Secondary | ICD-10-CM | POA: Diagnosis not present

## 2021-05-13 DIAGNOSIS — M9903 Segmental and somatic dysfunction of lumbar region: Secondary | ICD-10-CM | POA: Diagnosis not present

## 2021-06-10 DIAGNOSIS — M9901 Segmental and somatic dysfunction of cervical region: Secondary | ICD-10-CM | POA: Diagnosis not present

## 2021-06-10 DIAGNOSIS — M5411 Radiculopathy, occipito-atlanto-axial region: Secondary | ICD-10-CM | POA: Diagnosis not present

## 2021-06-10 DIAGNOSIS — M9903 Segmental and somatic dysfunction of lumbar region: Secondary | ICD-10-CM | POA: Diagnosis not present

## 2021-06-17 DIAGNOSIS — D485 Neoplasm of uncertain behavior of skin: Secondary | ICD-10-CM | POA: Diagnosis not present

## 2021-06-17 DIAGNOSIS — L821 Other seborrheic keratosis: Secondary | ICD-10-CM | POA: Diagnosis not present

## 2021-06-17 DIAGNOSIS — L570 Actinic keratosis: Secondary | ICD-10-CM | POA: Diagnosis not present

## 2021-06-17 DIAGNOSIS — D1801 Hemangioma of skin and subcutaneous tissue: Secondary | ICD-10-CM | POA: Diagnosis not present

## 2021-06-17 DIAGNOSIS — X32XXXS Exposure to sunlight, sequela: Secondary | ICD-10-CM | POA: Diagnosis not present

## 2021-06-17 DIAGNOSIS — D235 Other benign neoplasm of skin of trunk: Secondary | ICD-10-CM | POA: Diagnosis not present

## 2021-06-17 DIAGNOSIS — L814 Other melanin hyperpigmentation: Secondary | ICD-10-CM | POA: Diagnosis not present

## 2021-06-25 DIAGNOSIS — D235 Other benign neoplasm of skin of trunk: Secondary | ICD-10-CM | POA: Diagnosis not present

## 2021-06-26 DIAGNOSIS — H524 Presbyopia: Secondary | ICD-10-CM | POA: Diagnosis not present

## 2021-06-26 DIAGNOSIS — Z0100 Encounter for examination of eyes and vision without abnormal findings: Secondary | ICD-10-CM | POA: Diagnosis not present

## 2021-06-26 DIAGNOSIS — E7800 Pure hypercholesterolemia, unspecified: Secondary | ICD-10-CM | POA: Diagnosis not present

## 2021-06-26 DIAGNOSIS — I10 Essential (primary) hypertension: Secondary | ICD-10-CM | POA: Diagnosis not present

## 2021-07-02 DIAGNOSIS — L905 Scar conditions and fibrosis of skin: Secondary | ICD-10-CM | POA: Diagnosis not present

## 2021-07-08 DIAGNOSIS — M9903 Segmental and somatic dysfunction of lumbar region: Secondary | ICD-10-CM | POA: Diagnosis not present

## 2021-07-08 DIAGNOSIS — M9901 Segmental and somatic dysfunction of cervical region: Secondary | ICD-10-CM | POA: Diagnosis not present

## 2021-07-08 DIAGNOSIS — M5411 Radiculopathy, occipito-atlanto-axial region: Secondary | ICD-10-CM | POA: Diagnosis not present

## 2021-08-05 DIAGNOSIS — M9903 Segmental and somatic dysfunction of lumbar region: Secondary | ICD-10-CM | POA: Diagnosis not present

## 2021-08-05 DIAGNOSIS — M5411 Radiculopathy, occipito-atlanto-axial region: Secondary | ICD-10-CM | POA: Diagnosis not present

## 2021-08-05 DIAGNOSIS — M9901 Segmental and somatic dysfunction of cervical region: Secondary | ICD-10-CM | POA: Diagnosis not present

## 2021-09-09 DIAGNOSIS — M9903 Segmental and somatic dysfunction of lumbar region: Secondary | ICD-10-CM | POA: Diagnosis not present

## 2021-09-09 DIAGNOSIS — M5411 Radiculopathy, occipito-atlanto-axial region: Secondary | ICD-10-CM | POA: Diagnosis not present

## 2021-09-09 DIAGNOSIS — M9901 Segmental and somatic dysfunction of cervical region: Secondary | ICD-10-CM | POA: Diagnosis not present

## 2021-10-07 DIAGNOSIS — M5411 Radiculopathy, occipito-atlanto-axial region: Secondary | ICD-10-CM | POA: Diagnosis not present

## 2021-10-07 DIAGNOSIS — M9903 Segmental and somatic dysfunction of lumbar region: Secondary | ICD-10-CM | POA: Diagnosis not present

## 2021-10-07 DIAGNOSIS — M9901 Segmental and somatic dysfunction of cervical region: Secondary | ICD-10-CM | POA: Diagnosis not present

## 2021-11-04 DIAGNOSIS — M9901 Segmental and somatic dysfunction of cervical region: Secondary | ICD-10-CM | POA: Diagnosis not present

## 2021-11-04 DIAGNOSIS — M5411 Radiculopathy, occipito-atlanto-axial region: Secondary | ICD-10-CM | POA: Diagnosis not present

## 2021-11-04 DIAGNOSIS — M9903 Segmental and somatic dysfunction of lumbar region: Secondary | ICD-10-CM | POA: Diagnosis not present

## 2021-12-04 DIAGNOSIS — M9903 Segmental and somatic dysfunction of lumbar region: Secondary | ICD-10-CM | POA: Diagnosis not present

## 2021-12-04 DIAGNOSIS — M5411 Radiculopathy, occipito-atlanto-axial region: Secondary | ICD-10-CM | POA: Diagnosis not present

## 2021-12-04 DIAGNOSIS — M9901 Segmental and somatic dysfunction of cervical region: Secondary | ICD-10-CM | POA: Diagnosis not present

## 2021-12-18 ENCOUNTER — Other Ambulatory Visit: Payer: Self-pay | Admitting: Cardiology

## 2021-12-22 ENCOUNTER — Other Ambulatory Visit: Payer: Self-pay | Admitting: Cardiology

## 2021-12-30 DIAGNOSIS — M9903 Segmental and somatic dysfunction of lumbar region: Secondary | ICD-10-CM | POA: Diagnosis not present

## 2021-12-30 DIAGNOSIS — M5411 Radiculopathy, occipito-atlanto-axial region: Secondary | ICD-10-CM | POA: Diagnosis not present

## 2021-12-30 DIAGNOSIS — M9901 Segmental and somatic dysfunction of cervical region: Secondary | ICD-10-CM | POA: Diagnosis not present

## 2022-01-20 ENCOUNTER — Other Ambulatory Visit: Payer: Self-pay | Admitting: Cardiology

## 2022-01-20 DIAGNOSIS — N529 Male erectile dysfunction, unspecified: Secondary | ICD-10-CM | POA: Diagnosis not present

## 2022-01-20 DIAGNOSIS — Z0000 Encounter for general adult medical examination without abnormal findings: Secondary | ICD-10-CM | POA: Diagnosis not present

## 2022-01-20 DIAGNOSIS — I700 Atherosclerosis of aorta: Secondary | ICD-10-CM | POA: Diagnosis not present

## 2022-01-20 DIAGNOSIS — E785 Hyperlipidemia, unspecified: Secondary | ICD-10-CM | POA: Diagnosis not present

## 2022-01-20 DIAGNOSIS — Z125 Encounter for screening for malignant neoplasm of prostate: Secondary | ICD-10-CM | POA: Diagnosis not present

## 2022-01-20 DIAGNOSIS — I10 Essential (primary) hypertension: Secondary | ICD-10-CM | POA: Diagnosis not present

## 2022-03-10 DIAGNOSIS — M9903 Segmental and somatic dysfunction of lumbar region: Secondary | ICD-10-CM | POA: Diagnosis not present

## 2022-03-10 DIAGNOSIS — M9901 Segmental and somatic dysfunction of cervical region: Secondary | ICD-10-CM | POA: Diagnosis not present

## 2022-03-10 DIAGNOSIS — M5411 Radiculopathy, occipito-atlanto-axial region: Secondary | ICD-10-CM | POA: Diagnosis not present

## 2022-03-17 DIAGNOSIS — M5411 Radiculopathy, occipito-atlanto-axial region: Secondary | ICD-10-CM | POA: Diagnosis not present

## 2022-03-17 DIAGNOSIS — M9901 Segmental and somatic dysfunction of cervical region: Secondary | ICD-10-CM | POA: Diagnosis not present

## 2022-03-17 DIAGNOSIS — M9903 Segmental and somatic dysfunction of lumbar region: Secondary | ICD-10-CM | POA: Diagnosis not present

## 2022-03-22 NOTE — Progress Notes (Unsigned)
Cardiology Office Note:    Date:  03/23/2022   ID:  YOUSIF Lane, DOB 1945/11/03, MRN [id number]  PCP:  Orpah Melter, MD  Cardiologist:  Donato Heinz, MD  Electrophysiologist:  None   Referring MD: Orpah Melter, MD   No chief complaint on file.   History of Present Illness:    Randall Lane is a 76 y.o. male with a hx of hypertension, hyperlipidemia, prediabetes who presents for follow-up.  He was referred by Carolee Rota, NP for evaluation of chest pain, initially seen on 08/02/2019.  He reports that about 7 weeks prior he was doing sit ups at the gym.  While doing sit-ups he had the acute onset of upper abdominal pain.  Felt like he was getting punched in his abdomen/lower chest.  Since that time he has had intermittent chest tightness that occurs with certain movements.  Reports tightness in the center of his chest.,  Will last 2 to 3 seconds and resolved.  He normally is very active, goes to the gym at least 2 times per week and rides bicycle and lifts weights, typically spends at least an hour at the gym.  No exertional chest pain.  Denies any smoking history.  Father had PCI in 52s.  Given his description of chest pain consistent with musculoskeletal pain, recommended holding off on ischemia assessment at initial visit.  EKG notable for poor R wave progression, TTE was checked which showed no significant abnormalities.  Calcium score 244 (54th percentile) on 11/26/2020.  Since last clinic visit, he reports that he has been doing well.  Reports no further chest pain.  Denies any dyspnea, lightheadedness, syncope, lower extremity edema, or palpitations.  Golfs weekly and does a a lot of yard work.  Walks 2.5 miles 2 days/week.  Reports BP 130s over 70s when he checks at home.    Past Medical History:  Diagnosis Date   Hyperlipidemia    Hypertension     No past surgical history on file.  Current Medications: Current Meds  Medication Sig   amLODipine (NORVASC)  10 MG tablet Take 10 mg by mouth at bedtime.   Garlic 10 MG CAPS Take by mouth.   Hawthorn 150 MG CAPS Take by mouth.   lisinopril (ZESTRIL) 10 MG tablet Take 10 mg by mouth daily.   Multiple Vitamins-Minerals (MENS 50+ MULTI VITAMIN/MIN PO) Take 1 tablet by mouth daily.   Omega-3 Fatty Acids (FISH OIL) 1000 MG CAPS Take 2,000 mg by mouth daily.   [DISCONTINUED] atorvastatin (LIPITOR) 40 MG tablet Take 40 mg by mouth daily.     Allergies:   Lexapro [escitalopram]   Social History   Socioeconomic History   Marital status: Married    Spouse name: Not on file   Number of children: Not on file   Years of education: Not on file   Highest education level: Not on file  Occupational History   Not on file  Tobacco Use   Smoking status: Never   Smokeless tobacco: Never  Vaping Use   Vaping Use: Never used  Substance and Sexual Activity   Alcohol use: Yes    Comment: occ   Drug use: Never   Sexual activity: Yes  Other Topics Concern   Not on file  Social History Narrative   Not on file   Social Determinants of Health   Financial Resource Strain: Low Risk  (05/11/2018)   Overall Financial Resource Strain (CARDIA)    Difficulty of Paying Living Expenses:  Not very hard  Food Insecurity: No Food Insecurity (05/11/2018)   Hunger Vital Sign    Worried About Running Out of Food in the Last Year: Never true    Ran Out of Food in the Last Year: Never true  Transportation Needs: No Transportation Needs (05/11/2018)   PRAPARE - Hydrologist (Medical): No    Lack of Transportation (Non-Medical): No  Physical Activity: Unknown (05/11/2018)   Exercise Vital Sign    Days of Exercise per Week: Patient refused    Minutes of Exercise per Session: Patient refused  Stress: No Stress Concern Present (05/11/2018)   [place]    Feeling of Stress : Only a little  Social Connections: Socially  Integrated (05/11/2018)   Social Connection and Isolation Panel [NHANES]    Frequency of Communication with Friends and Family: More than three times a week    Frequency of Social Gatherings with Friends and Family: More than three times a week    Attends Religious Services: More than 4 times per year    Active Member of [employer] or Organizations: Yes    Attends Music therapist: More than 4 times per year    Marital Status: Married     Family History: Father had PCI in 57s  ROS:   Please see the history of present illness.     All other systems reviewed and are negative.  EKGs/Labs/Other Studies Reviewed:    The following studies were reviewed today:   EKG:   03/23/22: NSR, rate 63, no ST abnormalities. Poor R wave progression 10/22/2020- The EKG ordered today demonstrates normal sinus rhythm, Rate 63, Q waves in lead III and AVF, Poor R wave progression 08/02/2019-The EKG ordered demonstrates normal sinus rhythm, rate 76, sinus arrhythmia: Poor R wave progression  Recent Labs: No results found for requested labs within last 365 days.  Recent Lipid Panel No results found for: "CHOL", "TRIG", "HDL", "CHOLHDL", "VLDL", "LDLCALC", "LDLDIRECT"  Physical Exam:    VS:  BP 136/72   Pulse 63   Ht '5\' 8"'$  (1.727 m)   Wt 179 lb 12.8 oz (81.6 kg)   SpO2 99%   BMI 27.34 kg/m     Wt Readings from Last 3 Encounters:  03/23/22 179 lb 12.8 oz (81.6 kg)  11/10/20 180 lb (81.6 kg)  10/22/20 184 lb 3.2 oz (83.6 kg)     GEN:  Well nourished, well developed in no acute distress HEENT: Normal NECK: No JVD CARDIAC: RRR, no murmurs, rubs, gallops RESPIRATORY:  Clear to auscultation without rales, wheezing or rhonchi  ABDOMEN: Soft, non-tender, non-distended MUSCULOSKELETAL:  No edema; No deformity  SKIN: Warm and dry NEUROLOGIC:  Alert and oriented x 3 PSYCHIATRIC:  Normal affect   ASSESSMENT:    1. Abnormal EKG   2. Essential hypertension   3. Hyperlipidemia,  unspecified hyperlipidemia type   4. Coronary artery disease involving native coronary artery of native heart without angina pectoris     PLAN:    Chest pain: Description suggests musculoskeletal pain, as describes chest pain with certain movements that lasts few seconds and resolves.  Has resolved.  Denies any exertional chest pain.  No further work-up recommended at this time.   Abnormal EKG: Poor R wave progression on EKG. No evidence of prior MI on TTE 08/12/2019   Hypertension: On amlodipine 10 mg daily and lisinopril 10 mg daily.  Appears controlled   Hyperlipidemia: LDL 88  on 12/2019 on atorvastatin 40 mg daily.  Calcium score 244 (54th percentile) on 11/26/2020.  Atorvastatin increased to 80 mg daily.  LDL 60 on 01/20/22   RTC in 1 year  Medication Adjustments/Labs and Tests Ordered: Current medicines are reviewed at length with the patient today.  Concerns regarding medicines are outlined above.  Orders Placed This Encounter  Procedures   EKG 12-Lead   Meds ordered this encounter  Medications   atorvastatin (LIPITOR) 80 MG tablet    Sig: Take 1 tablet (80 mg total) by mouth daily.    Dispense:  90 tablet    Refill:  3    Patient Instructions  Medication Instructions:  INCREASE atorvastatin to 80 mg daily  *If you need a refill on your cardiac medications before your next appointment, please call your pharmacy*  Follow-Up: At [facility], you and your health needs are our priority.  As part of our continuing mission to provide you with exceptional heart care, we have created designated Provider Care Teams.  These Care Teams include your primary Cardiologist (physician) and Advanced Practice Providers (APPs -  Physician Assistants and Nurse Practitioners) who all work together to provide you with the care you need, when you need it.  We recommend signing up for the patient portal called "MyChart".  Sign up information is provided on this After Visit Summary.   MyChart is used to connect with patients for Virtual Visits (Telemedicine).  Patients are able to view lab/test results, encounter notes, upcoming appointments, etc.  Non-urgent messages can be sent to your provider as well.   To learn more about what you can do with MyChart, go to [email or web address].    Your next appointment:   12 month(s)  The format for your next appointment:   In Person  Provider:   Donato Heinz, MD               Signed, Donato Heinz, MD  03/23/2022 9:56 AM    [place]

## 2022-03-23 ENCOUNTER — Encounter: Payer: Self-pay | Admitting: Cardiology

## 2022-03-23 ENCOUNTER — Ambulatory Visit: Payer: Medicare HMO | Attending: Cardiology | Admitting: Cardiology

## 2022-03-23 VITALS — BP 136/72 | HR 63 | Ht 68.0 in | Wt 179.8 lb

## 2022-03-23 DIAGNOSIS — I10 Essential (primary) hypertension: Secondary | ICD-10-CM | POA: Diagnosis not present

## 2022-03-23 DIAGNOSIS — E785 Hyperlipidemia, unspecified: Secondary | ICD-10-CM

## 2022-03-23 DIAGNOSIS — R9431 Abnormal electrocardiogram [ECG] [EKG]: Secondary | ICD-10-CM | POA: Diagnosis not present

## 2022-03-23 DIAGNOSIS — I2510 Atherosclerotic heart disease of native coronary artery without angina pectoris: Secondary | ICD-10-CM

## 2022-03-23 MED ORDER — ATORVASTATIN CALCIUM 80 MG PO TABS: 80.0000 mg | ORAL_TABLET | Freq: Every day | ORAL | 3 refills | Status: AC

## 2022-03-23 NOTE — Patient Instructions (Signed)
Medication Instructions:  INCREASE atorvastatin to 80 mg daily  *If you need a refill on your cardiac medications before your next appointment, please call your pharmacy*  Follow-Up: At [facility], you and your health needs are our priority.  As part of our continuing mission to provide you with exceptional heart care, we have created designated Provider Care Teams.  These Care Teams include your primary Cardiologist (physician) and Advanced Practice Providers (APPs -  Physician Assistants and Nurse Practitioners) who all work together to provide you with the care you need, when you need it.  We recommend signing up for the patient portal called "MyChart".  Sign up information is provided on this After Visit Summary.  MyChart is used to connect with patients for Virtual Visits (Telemedicine).  Patients are able to view lab/test results, encounter notes, upcoming appointments, etc.  Non-urgent messages can be sent to your provider as well.   To learn more about what you can do with MyChart, go to [email or web address].    Your next appointment:   12 month(s)  The format for your next appointment:   In Person  Provider:   Donato Heinz, MD

## 2022-04-14 DIAGNOSIS — M9903 Segmental and somatic dysfunction of lumbar region: Secondary | ICD-10-CM | POA: Diagnosis not present

## 2022-04-14 DIAGNOSIS — M9901 Segmental and somatic dysfunction of cervical region: Secondary | ICD-10-CM | POA: Diagnosis not present

## 2022-04-14 DIAGNOSIS — Z23 Encounter for immunization: Secondary | ICD-10-CM | POA: Diagnosis not present

## 2022-04-14 DIAGNOSIS — M5411 Radiculopathy, occipito-atlanto-axial region: Secondary | ICD-10-CM | POA: Diagnosis not present

## 2022-05-12 DIAGNOSIS — M9901 Segmental and somatic dysfunction of cervical region: Secondary | ICD-10-CM | POA: Diagnosis not present

## 2022-05-12 DIAGNOSIS — M9903 Segmental and somatic dysfunction of lumbar region: Secondary | ICD-10-CM | POA: Diagnosis not present

## 2022-05-12 DIAGNOSIS — M5411 Radiculopathy, occipito-atlanto-axial region: Secondary | ICD-10-CM | POA: Diagnosis not present

## 2022-05-29 DIAGNOSIS — R69 Illness, unspecified: Secondary | ICD-10-CM | POA: Diagnosis not present

## 2022-06-09 DIAGNOSIS — M9901 Segmental and somatic dysfunction of cervical region: Secondary | ICD-10-CM | POA: Diagnosis not present

## 2022-06-09 DIAGNOSIS — M9903 Segmental and somatic dysfunction of lumbar region: Secondary | ICD-10-CM | POA: Diagnosis not present

## 2022-06-09 DIAGNOSIS — M5411 Radiculopathy, occipito-atlanto-axial region: Secondary | ICD-10-CM | POA: Diagnosis not present

## 2022-06-25 DIAGNOSIS — L821 Other seborrheic keratosis: Secondary | ICD-10-CM | POA: Diagnosis not present

## 2022-06-25 DIAGNOSIS — D225 Melanocytic nevi of trunk: Secondary | ICD-10-CM | POA: Diagnosis not present

## 2022-06-25 DIAGNOSIS — L814 Other melanin hyperpigmentation: Secondary | ICD-10-CM | POA: Diagnosis not present

## 2022-06-25 DIAGNOSIS — L570 Actinic keratosis: Secondary | ICD-10-CM | POA: Diagnosis not present

## 2022-06-25 DIAGNOSIS — L918 Other hypertrophic disorders of the skin: Secondary | ICD-10-CM | POA: Diagnosis not present

## 2022-06-25 DIAGNOSIS — Z86018 Personal history of other benign neoplasm: Secondary | ICD-10-CM | POA: Diagnosis not present

## 2022-06-29 DIAGNOSIS — R69 Illness, unspecified: Secondary | ICD-10-CM | POA: Diagnosis not present

## 2022-07-07 DIAGNOSIS — M5411 Radiculopathy, occipito-atlanto-axial region: Secondary | ICD-10-CM | POA: Diagnosis not present

## 2022-07-07 DIAGNOSIS — M9903 Segmental and somatic dysfunction of lumbar region: Secondary | ICD-10-CM | POA: Diagnosis not present

## 2022-07-07 DIAGNOSIS — M9901 Segmental and somatic dysfunction of cervical region: Secondary | ICD-10-CM | POA: Diagnosis not present

## 2022-07-08 DIAGNOSIS — I10 Essential (primary) hypertension: Secondary | ICD-10-CM | POA: Diagnosis not present

## 2022-07-08 DIAGNOSIS — E785 Hyperlipidemia, unspecified: Secondary | ICD-10-CM | POA: Diagnosis not present

## 2022-07-08 DIAGNOSIS — I700 Atherosclerosis of aorta: Secondary | ICD-10-CM | POA: Diagnosis not present

## 2022-07-15 DIAGNOSIS — M5411 Radiculopathy, occipito-atlanto-axial region: Secondary | ICD-10-CM | POA: Diagnosis not present

## 2022-07-15 DIAGNOSIS — M9901 Segmental and somatic dysfunction of cervical region: Secondary | ICD-10-CM | POA: Diagnosis not present

## 2022-07-15 DIAGNOSIS — M9903 Segmental and somatic dysfunction of lumbar region: Secondary | ICD-10-CM | POA: Diagnosis not present

## 2022-07-22 DIAGNOSIS — M5411 Radiculopathy, occipito-atlanto-axial region: Secondary | ICD-10-CM | POA: Diagnosis not present

## 2022-07-22 DIAGNOSIS — M9901 Segmental and somatic dysfunction of cervical region: Secondary | ICD-10-CM | POA: Diagnosis not present

## 2022-07-22 DIAGNOSIS — M9903 Segmental and somatic dysfunction of lumbar region: Secondary | ICD-10-CM | POA: Diagnosis not present

## 2022-07-28 DIAGNOSIS — R69 Illness, unspecified: Secondary | ICD-10-CM | POA: Diagnosis not present

## 2022-08-11 DIAGNOSIS — M9901 Segmental and somatic dysfunction of cervical region: Secondary | ICD-10-CM | POA: Diagnosis not present

## 2022-08-11 DIAGNOSIS — M5411 Radiculopathy, occipito-atlanto-axial region: Secondary | ICD-10-CM | POA: Diagnosis not present

## 2022-08-11 DIAGNOSIS — M9903 Segmental and somatic dysfunction of lumbar region: Secondary | ICD-10-CM | POA: Diagnosis not present

## 2022-08-25 DIAGNOSIS — R69 Illness, unspecified: Secondary | ICD-10-CM | POA: Diagnosis not present

## 2022-08-28 DIAGNOSIS — R69 Illness, unspecified: Secondary | ICD-10-CM | POA: Diagnosis not present

## 2022-09-07 DIAGNOSIS — L255 Unspecified contact dermatitis due to plants, except food: Secondary | ICD-10-CM | POA: Diagnosis not present

## 2022-09-08 DIAGNOSIS — M9903 Segmental and somatic dysfunction of lumbar region: Secondary | ICD-10-CM | POA: Diagnosis not present

## 2022-09-08 DIAGNOSIS — M9901 Segmental and somatic dysfunction of cervical region: Secondary | ICD-10-CM | POA: Diagnosis not present

## 2022-09-08 DIAGNOSIS — M5411 Radiculopathy, occipito-atlanto-axial region: Secondary | ICD-10-CM | POA: Diagnosis not present

## 2022-09-25 DIAGNOSIS — R69 Illness, unspecified: Secondary | ICD-10-CM | POA: Diagnosis not present

## 2022-09-27 DIAGNOSIS — R69 Illness, unspecified: Secondary | ICD-10-CM | POA: Diagnosis not present

## 2022-09-28 DIAGNOSIS — M5411 Radiculopathy, occipito-atlanto-axial region: Secondary | ICD-10-CM | POA: Diagnosis not present

## 2022-09-28 DIAGNOSIS — M9901 Segmental and somatic dysfunction of cervical region: Secondary | ICD-10-CM | POA: Diagnosis not present

## 2022-09-28 DIAGNOSIS — M9903 Segmental and somatic dysfunction of lumbar region: Secondary | ICD-10-CM | POA: Diagnosis not present

## 2022-10-09 DIAGNOSIS — R69 Illness, unspecified: Secondary | ICD-10-CM | POA: Diagnosis not present

## 2022-10-16 DIAGNOSIS — R69 Illness, unspecified: Secondary | ICD-10-CM | POA: Diagnosis not present

## 2022-10-28 DIAGNOSIS — R69 Illness, unspecified: Secondary | ICD-10-CM | POA: Diagnosis not present

## 2022-11-03 DIAGNOSIS — M9903 Segmental and somatic dysfunction of lumbar region: Secondary | ICD-10-CM | POA: Diagnosis not present

## 2022-11-03 DIAGNOSIS — M9901 Segmental and somatic dysfunction of cervical region: Secondary | ICD-10-CM | POA: Diagnosis not present

## 2022-11-03 DIAGNOSIS — M5411 Radiculopathy, occipito-atlanto-axial region: Secondary | ICD-10-CM | POA: Diagnosis not present

## 2022-11-06 DIAGNOSIS — R69 Illness, unspecified: Secondary | ICD-10-CM | POA: Diagnosis not present

## 2022-11-24 DIAGNOSIS — M5411 Radiculopathy, occipito-atlanto-axial region: Secondary | ICD-10-CM | POA: Diagnosis not present

## 2022-11-24 DIAGNOSIS — M9903 Segmental and somatic dysfunction of lumbar region: Secondary | ICD-10-CM | POA: Diagnosis not present

## 2022-11-24 DIAGNOSIS — M9901 Segmental and somatic dysfunction of cervical region: Secondary | ICD-10-CM | POA: Diagnosis not present

## 2022-11-25 ENCOUNTER — Observation Stay (HOSPITAL_COMMUNITY): Payer: Medicare HMO

## 2022-11-25 ENCOUNTER — Emergency Department (HOSPITAL_BASED_OUTPATIENT_CLINIC_OR_DEPARTMENT_OTHER): Payer: Medicare HMO

## 2022-11-25 ENCOUNTER — Observation Stay (HOSPITAL_BASED_OUTPATIENT_CLINIC_OR_DEPARTMENT_OTHER)
Admission: EM | Admit: 2022-11-25 | Discharge: 2022-11-26 | Disposition: A | Discharge status: Home / Self Care | Payer: Medicare HMO | Attending: Internal Medicine | Admitting: Internal Medicine

## 2022-11-25 ENCOUNTER — Other Ambulatory Visit: Payer: Self-pay

## 2022-11-25 ENCOUNTER — Encounter (HOSPITAL_BASED_OUTPATIENT_CLINIC_OR_DEPARTMENT_OTHER): Payer: Self-pay

## 2022-11-25 DIAGNOSIS — Z79899 Other long term (current) drug therapy: Secondary | ICD-10-CM | POA: Diagnosis not present

## 2022-11-25 DIAGNOSIS — I6782 Cerebral ischemia: Secondary | ICD-10-CM | POA: Diagnosis not present

## 2022-11-25 DIAGNOSIS — R200 Anesthesia of skin: Principal | ICD-10-CM | POA: Insufficient documentation

## 2022-11-25 DIAGNOSIS — G459 Transient cerebral ischemic attack, unspecified: Secondary | ICD-10-CM | POA: Insufficient documentation

## 2022-11-25 DIAGNOSIS — E785 Hyperlipidemia, unspecified: Secondary | ICD-10-CM

## 2022-11-25 DIAGNOSIS — I672 Cerebral atherosclerosis: Secondary | ICD-10-CM | POA: Diagnosis not present

## 2022-11-25 DIAGNOSIS — R29818 Other symptoms and signs involving the nervous system: Secondary | ICD-10-CM | POA: Diagnosis not present

## 2022-11-25 DIAGNOSIS — J387 Other diseases of larynx: Secondary | ICD-10-CM | POA: Insufficient documentation

## 2022-11-25 DIAGNOSIS — I10 Essential (primary) hypertension: Secondary | ICD-10-CM | POA: Diagnosis not present

## 2022-11-25 DIAGNOSIS — R079 Chest pain, unspecified: Secondary | ICD-10-CM | POA: Diagnosis not present

## 2022-11-25 DIAGNOSIS — R9089 Other abnormal findings on diagnostic imaging of central nervous system: Secondary | ICD-10-CM | POA: Diagnosis not present

## 2022-11-25 LAB — COMPREHENSIVE METABOLIC PANEL
ALT: 19 U/L (ref 0–44)
AST: 20 U/L (ref 15–41)
Albumin: 3.8 g/dL (ref 3.5–5.0)
Alkaline Phosphatase: 103 U/L (ref 38–126)
Anion gap: 9 (ref 5–15)
BUN: 16 mg/dL (ref 8–23)
CO2: 25 mmol/L (ref 22–32)
Calcium: 8.8 mg/dL — ABNORMAL LOW (ref 8.9–10.3)
Chloride: 103 mmol/L (ref 98–111)
Creatinine, Ser: 1.15 mg/dL (ref 0.61–1.24)
GFR, Estimated: >60 mL/min (ref >60)
Glucose, Bld: 142 mg/dL — ABNORMAL HIGH (ref 70–99)
Potassium: 4.1 mmol/L (ref 3.5–5.1)
Sodium: 137 mmol/L (ref 135–145)
Total Bilirubin: 0.5 mg/dL (ref 0.3–1.2)
Total Protein: 6.9 g/dL (ref 6.5–8.1)

## 2022-11-25 LAB — URINALYSIS, ROUTINE W REFLEX MICROSCOPIC
Bilirubin Urine: NEGATIVE
Glucose, UA: NEGATIVE mg/dL
Hgb urine dipstick: NEGATIVE
Ketones, ur: NEGATIVE mg/dL
Leukocytes,Ua: NEGATIVE
Nitrite: NEGATIVE
Protein, ur: NEGATIVE mg/dL
Specific Gravity, Urine: 1.02 (ref 1.005–1.030)
pH: 7 (ref 5.0–8.0)

## 2022-11-25 LAB — DIFFERENTIAL
Abs Immature Granulocytes: 0.03 10*3/uL (ref 0.00–0.07)
Basophils Absolute: 0.1 10*3/uL (ref 0.0–0.1)
Basophils Relative: 1 %
Eosinophils Absolute: 0.1 10*3/uL (ref 0.0–0.5)
Eosinophils Relative: 1 %
Immature Granulocytes: 0 %
Lymphocytes Relative: 19 %
Lymphs Abs: 1.4 10*3/uL (ref 0.7–4.0)
Monocytes Absolute: 0.5 10*3/uL (ref 0.1–1.0)
Monocytes Relative: 7 %
Neutro Abs: 5.3 10*3/uL (ref 1.7–7.7)
Neutrophils Relative %: 72 %

## 2022-11-25 LAB — CBC
HCT: 44.1 % (ref 39.0–52.0)
Hemoglobin: 14.9 g/dL (ref 13.0–17.0)
MCH: 30.4 pg (ref 26.0–34.0)
MCHC: 33.8 g/dL (ref 30.0–36.0)
MCV: 90 fL (ref 80.0–100.0)
Platelets: 239 10*3/uL (ref 150–400)
RBC: 4.9 MIL/uL (ref 4.22–5.81)
RDW: 13 % (ref 11.5–15.5)
WBC: 7.4 10*3/uL (ref 4.0–10.5)
nRBC: 0 % (ref 0.0–0.2)

## 2022-11-25 LAB — TROPONIN I (HIGH SENSITIVITY)
Troponin I (High Sensitivity): 3 ng/L (ref <18)
Troponin I (High Sensitivity): 3 ng/L (ref <18)

## 2022-11-25 LAB — CBG MONITORING, ED: Glucose-Capillary: 147 mg/dL — ABNORMAL HIGH (ref 70–99)

## 2022-11-25 LAB — PROTIME-INR
INR: 1 (ref 0.8–1.2)
Prothrombin Time: 12.9 seconds (ref 11.4–15.2)

## 2022-11-25 LAB — RAPID URINE DRUG SCREEN, HOSP PERFORMED
Amphetamines: NOT DETECTED
Barbiturates: NOT DETECTED
Benzodiazepines: NOT DETECTED
Cocaine: NOT DETECTED
Opiates: NOT DETECTED
Tetrahydrocannabinol: NOT DETECTED

## 2022-11-25 LAB — APTT: aPTT: 26 seconds (ref 24–36)

## 2022-11-25 LAB — ETHANOL: Alcohol, Ethyl (B): <10 mg/dL (ref <10)

## 2022-11-25 MED ORDER — VERAPAMIL HCL ER 120 MG PO TBCR
120.0000 mg | EXTENDED_RELEASE_TABLET | Freq: Two times a day (BID) | ORAL | Status: DC
  Filled 2022-11-25: qty 1

## 2022-11-25 MED ORDER — ASPIRIN 81 MG PO CHEW
324.0000 mg | CHEWABLE_TABLET | Freq: Once | ORAL | Status: AC
  Administered 2022-11-25: 324 mg via ORAL
  Filled 2022-11-25: qty 4

## 2022-11-25 MED ORDER — ACETAMINOPHEN 650 MG RE SUPP: 650.0000 mg | RECTAL | Status: DC | PRN

## 2022-11-25 MED ORDER — LISINOPRIL 20 MG PO TABS
20.0000 mg | ORAL_TABLET | Freq: Every day | ORAL | Status: DC
  Administered 2022-11-25: 20 mg via ORAL
  Filled 2022-11-25: qty 2

## 2022-11-25 MED ORDER — ACETAMINOPHEN 325 MG PO TABS: 650.0000 mg | ORAL_TABLET | ORAL | Status: DC | PRN

## 2022-11-25 MED ORDER — SENNOSIDES-DOCUSATE SODIUM 8.6-50 MG PO TABS: 1.0000 | ORAL_TABLET | Freq: Every evening | ORAL | Status: DC | PRN

## 2022-11-25 MED ORDER — STROKE: EARLY STAGES OF RECOVERY BOOK
Freq: Once | Status: AC
  Filled 2022-11-25: qty 1

## 2022-11-25 MED ORDER — IOHEXOL 350 MG/ML SOLN
75.0000 mL | Freq: Once | INTRAVENOUS | Status: AC | PRN
  Administered 2022-11-25: 75 mL via INTRAVENOUS

## 2022-11-25 MED ORDER — ATORVASTATIN CALCIUM 80 MG PO TABS
80.0000 mg | ORAL_TABLET | Freq: Every day | ORAL | Status: DC
  Administered 2022-11-25 – 2022-11-26 (×2): 80 mg via ORAL
  Filled 2022-11-25: qty 2
  Filled 2022-11-25: qty 1

## 2022-11-25 MED ORDER — ASPIRIN 81 MG PO TBEC
81.0000 mg | DELAYED_RELEASE_TABLET | Freq: Every day | ORAL | Status: DC
  Administered 2022-11-26: 81 mg via ORAL
  Filled 2022-11-25: qty 1

## 2022-11-25 MED ORDER — ACETAMINOPHEN 160 MG/5ML PO SOLN: 650.0000 mg | ORAL | Status: DC | PRN

## 2022-11-25 NOTE — ED Notes (Signed)
Report called to [facility] and inpatient nurse

## 2022-11-25 NOTE — ED Notes (Addendum)
CT notified Code Stroke activated by MD Bernette Mayers

## 2022-11-25 NOTE — ED Notes (Signed)
Patient called out stating he is "getting the warm sensation like he had when this event started last night" on the left side of his rib cage. NIH negative, no weakness or deficits noted. Dr. Lockie Mola notified. Pt informed to let us know if symptoms worsen.

## 2022-11-25 NOTE — ED Notes (Signed)
Pt ambulatory to restroom without assistance.

## 2022-11-25 NOTE — Progress Notes (Signed)
Plan of Care Note for accepted transfer   [name] MRN: [id number]   DOA: 11/25/2022  Facility requesting transfer: [facility]   Requesting Provider: Dr. Bernette Mayers   Reason for transfer: TIA/CVA workup   Facility course: 77 yr old gentleman with HTN and HLD p/w numbness involving left face, left arm, and left leg.   He had neck adjusted by a chiropractor yesterday, went to be in his usual state ~20:30 and then noticed the numbness when he woke ~00:40.   There are no acute findings on head CT. CTA head & neck is ordered but not yet performed.   Teleneurology evaluated the patient and recommended admission for further workup.   Plan of care: The patient is accepted for admission to Telemetry unit, at [facility].   Author: Briscoe Deutscher, MD 11/25/2022  Check [email or web address] for on-call coverage.  Nursing staff, Please call [facility] Admits & Consults System-Wide number on Amion as soon as patient's arrival, so appropriate admitting provider can evaluate the pt.

## 2022-11-25 NOTE — ED Provider Notes (Signed)
Patient was curious if he was having any cardiac symptoms from the tingling that he was having today.  Think it is reasonable to expand her chest x-ray and troponin.  He remains hemodynamically stable and neurologically stable.  Will add troponin and chest x-ray.   Virgina Norfolk, DO 11/25/22 [phone]

## 2022-11-25 NOTE — Evaluation (Signed)
Physical Therapy Evaluation Patient Details Name: Randall Lane MRN: [id number] DOB: 1946/03/31 Today's Date: 11/25/2022  History of Present Illness  Pt is 77 yo male who presented 11/25/22 with new onset L sided numbness. MRI negative for acute changes. Pt with hx of HTN and HLD.  Clinical Impression  Pt admitted with above diagnosis. At baseline he is independent. His only symptom at admission was numbness L upper arm, face, and trunk.  Reports these symptoms have resolved.  Pt was able to ambulate at independent level with good balance  and participated with dynamic balance challenges without difficulty.  Pt is back to baseline and independent.  No acute PT needs.       Assistance Recommended at Discharge None  If plan is discharge home, recommend the following:  Can travel by private vehicle           Equipment Recommendations None recommended by PT  Recommendations for Other Services       Functional Status Assessment Patient has not had a recent decline in their functional status     Precautions / Restrictions Precautions Precautions: None      Mobility  Bed Mobility Overal bed mobility: Independent                  Transfers Overall transfer level: Independent                 General transfer comment: Mobilizing in room independently.  Demonstrated safely for therapy    Ambulation/Gait Ambulation/Gait assistance: Independent Gait Distance (Feet): 400 Feet Assistive device: None Gait Pattern/deviations: WFL(Within Functional Limits) Gait velocity: normal     General Gait Details: Ambulating in room independently.  Demonstrated safely for PT  Stairs            Wheelchair Mobility     Tilt Bed    Modified Rankin (Stroke Patients Only)       Balance Overall balance assessment: Independent   Sitting balance-Leahy Scale: Normal       Standing balance-Leahy Scale: Normal               High level balance activites: Side  stepping, Backward walking, Direction changes, Turns, Sudden stops, Head turns High Level Balance Comments: All above and stepping over objects without difficulty - good balance and maintained speed             Pertinent Vitals/Pain Pain Assessment Pain Assessment: No/denies pain    Home Living Family/patient expects to be discharged to:: Private residence Living Arrangements: Spouse/significant other Available Help at Discharge: Family;Available 24 hours/day Type of Home: House           Home Equipment: None      Prior Function Prior Level of Function : Independent/Modified Independent;Driving             Mobility Comments: Completely independent with ADLs, IADLs, driving, ambulation.  No hx falls       Hand Dominance        Extremity/Trunk Assessment   Upper Extremity Assessment Upper Extremity Assessment: Overall WFL for tasks assessed (ROM WFL, finger tip to tip WFL, MMT 5/5, No numbness)    Lower Extremity Assessment Lower Extremity Assessment: Overall WFL for tasks assessed (ROM WFL, MMT 5/5, heel/shin coordination WNL, no numbness)    Cervical / Trunk Assessment Cervical / Trunk Exceptions: Pt reports numbness at admission was L face , trunk, and upper arm.  This has completely resolved  Communication   Communication: No difficulties  Cognition  Arousal/Alertness: Awake/alert Behavior During Therapy: WFL for tasks assessed/performed Overall Cognitive Status: Within Functional Limits for tasks assessed                                 General Comments: Answers questions appropriately, follows commands, able to way find back to room after at least 3 turns        General Comments      Exercises     Assessment/Plan    PT Assessment Patient does not need any further PT services  PT Problem List         PT Treatment Interventions      PT Goals (Current goals can be found in the Care Plan section)  Acute Rehab PT  Goals Patient Stated Goal: return home in time for cookout tomorrow PT Goal Formulation: All assessment and education complete, DC therapy    Frequency       Co-evaluation               AM-PAC PT "6 Clicks" Mobility  Outcome Measure Help needed turning from your back to your side while in a flat bed without using bedrails?: None Help needed moving from lying on your back to sitting on the side of a flat bed without using bedrails?: None Help needed moving to and from a bed to a chair (including a wheelchair)?: None Help needed standing up from a chair using your arms (e.g., wheelchair or bedside chair)?: None Help needed to walk in hospital room?: None Help needed climbing 3-5 steps with a railing? : None 6 Click Score: 24    End of Session   Activity Tolerance: Patient tolerated treatment well Patient left: in bed;with call bell/phone within reach Nurse Communication: Mobility status PT Visit Diagnosis: Other abnormalities of gait and mobility (R26.89)    Time: 1610-9604 PT Time Calculation (min) (ACUTE ONLY): 14 min   Charges:   PT Evaluation $PT Eval Low Complexity: 1 Low   PT General Charges $$ ACUTE PT VISIT: 1 Visit         Anise Salvo, PT Acute Rehab Services [facility] [phone]   Rayetta Humphrey 11/25/2022, 5:17 PM

## 2022-11-25 NOTE — Consult Note (Signed)
[facility] Tele[facility]urology Consult Services   Patient Name:   Randall Lane, Randall Lane Date of Birth:   Dec 20, 1945 Identification Number:   MRN - [id number] Date of Service:   11/25/2022 03:13:38  Diagnosis:       I63.89 - Cerebrovascular accident (CVA) due to other mechanism (HCCC)       G45.9 - Transient cerebral ischemic attack, unspecified  Impression:  Pt is a 68 YOM with PMH of HTN, HLD who presented with left sided face/arm weakness/numbness/tingling. NIHSS: 0. Deferred thrombolytics. Get CTA head/neck to eval for dissection vs LVO. Admit for TIA/STROKE vs TOXIC/METABOLIC/INFECTIOUS process.    Monitor neuro checks/VS q4h with telemetry.  Recommend fall precautions and seizure precautions.  Goal SBP b/w 160-180 today, 140-160 -> 100-140 afterwards.  Start ASA + STATIN if no contraindications.  Get MRI BRAIN W/O, CTA head/neck, and ECHO.  Get ESR/CRP, TROP, CK, TSH, B12, BNP, LACTIC ACID, LIPID PANEL, and A1C.  Get WORKUP for TOXIC/METABOLIC/INFECTIOUS causes.  PT/OT/ST eval.    Our recommendations are outlined below.  Recommendations:        Stroke/Telemetry Floor       Neuro Checks       Bedside Swallow Eval       DVT Prophylaxis       IV Fluids, Normal Saline       Head of Bed 30 Degrees       Euglycemia and Avoid Hyperthermia (PRN Acetaminophen)  Sign Out:       Discussed with Emergency Department Provider    ------------------------------------------------------------------------------  Advanced Imaging: Advanced Imaging Deferred because:  Non-disabling symptoms as verified by the patient; no cortical signs so not consistent with LVO   Metrics: Last Known Well: 11/24/2022 22:30:00 [facility] Notification Time: 11/25/2022 03:13:38 Arrival Time: 11/25/2022 02:38:00 Stamp Time: 11/25/2022 03:13:38 Initial Response Time: 11/25/2022 03:18:10 Symptoms: left sided face/arm weakness/numbness/tingling. Initial patient interaction: 11/25/2022  03:23:34 NIHSS Assessment Completed: 11/25/2022 40:98:11 Patient is not a candidate for Thrombolytic. Thrombolytic Medical Decision: 11/25/2022 03:28:45 Patient was not deemed candidate for Thrombolytic because of following reasons: Last Well Known Above 4.5 Hours.  CT head showed no acute hemorrhage or acute core infarct.  Primary Provider Notified of Diagnostic Impression and Management Plan on: 11/25/2022 03:48:38    ------------------------------------------------------------------------------  History of Present Illness: Patient was brought by EMS for symptoms of left sided face/arm weakness/numbness/tingling.  Pt is a 67 YOM with PMH of HTN, HLD who presented with left sided face/arm weakness/numbness/tingling. He went to bed normal at 2230 and woke up with issues around 0030. No prior strokes. No ASA or AC use. He is side sleeper and may have slept on left side. He had a chiropractic manipulation yesterday.   Past Medical History:      Hypertension      Hyperlipidemia      There is no history of Stroke  Medications:  No Anticoagulant use  No Antiplatelet use Reviewed EMR for current medications  Allergies:  Reviewed  Social History: Smoking: No Alcohol Use: No Drug Use: No  Family History:  There is no family history of premature cerebrovascular disease pertinent to this consultation  ROS : 14 Points Review of Systems was performed and was negative except mentioned in HPI.  Past Surgical History: There Is No Surgical History Contributory To Today's Visit    Examination: BP(163/84), Pulse(64), Blood Glucose(147) 1A: Level of Consciousness - Alert; keenly responsive + 0 1B: Ask Month and Age - Both Questions Right + 0 1C: Blink Eyes & Squeeze  Hands - Performs Both Tasks + 0 2: Test Horizontal Extraocular Movements - Normal + 0 3: Test Visual Fields - No Visual Loss + 0 4: Test Facial Palsy (Use Grimace if Obtunded) - Normal symmetry + 0 5A: Test Left  Arm Motor Drift - No Drift for 10 Seconds + 0 5B: Test Right Arm Motor Drift - No Drift for 10 Seconds + 0 6A: Test Left Leg Motor Drift - No Drift for 5 Seconds + 0 6B: Test Right Leg Motor Drift - No Drift for 5 Seconds + 0 7: Test Limb Ataxia (FNF/Heel-Shin) - No Ataxia + 0 8: Test Sensation - Normal; No sensory loss + 0 9: Test Language/Aphasia - Normal; No aphasia + 0 10: Test Dysarthria - Normal + 0 11: Test Extinction/Inattention - No abnormality + 0  NIHSS Score: 0   Pre-Morbid Modified Rankin Scale: 0 Points = No symptoms at all  Spoke with : ED  This consult was conducted in real time using interactive audio and Immunologist. Patient was informed of the technology being used for this visit and agreed to proceed. Patient located in hospital and provider located at home/office setting.   Patient is being evaluated for possible acute neurologic impairment and high probability of imminent or life-threatening deterioration. Lane spent total of 30 minutes providing care to this patient, including time for face to face visit via telemedicine, review of medical records, imaging studies and discussion of findings with providers, the patient and/or family.   Dr Marcene Corning   [facility] For Inpatient follow-up with [facility] physician please call [facility] [phone]. This is not an outpatient service. Post hospital discharge, please contact hospital directly.  Please do not communicate with [facility] physicians via secure chat. If you have any questions, Please contact [facility]. Please call or reconsult our service if there are any clinical or diagnostic changes.

## 2022-11-25 NOTE — Progress Notes (Signed)
Elert at 0301, EDMD at bedside, patient with left side weakness upon awakening at 0030.  LKW at 2230 per patient.  mRs 0 at 0301, to CT at 0304, back from CT at 0313.  Paged neurology at [phone]. Per patient no use of blood thinner. Dr. Allena Katz with neurology on at [phone]. No lytic due to LKW.> 4.5hr  Off call at 0334.

## 2022-11-25 NOTE — ED Notes (Signed)
Pt provided with breakfast

## 2022-11-25 NOTE — ED Provider Notes (Signed)
[place] [facility]  Provider Note  CSN: [id number] Arrival date & time: 11/25/22 2725  History Chief Complaint  Patient presents with   Numbness   Code Stroke    Randall Lane is a 77 y.o. male with history of HTN and cervical disc disease reports he went to bed around 2230hrs feeling well. He was woken up around 0030 feeling 'weird' which he describes as a numbness on his L face, arm and leg. He denies any slurred speech, facial droop or weakness. He did not improve after getting out of bed and so came to the ED for evaluation. He has no prior history of stroke. He did have a chiropractic adjustment of his C-spine earlier in the day but no different than his usual visits there. Having some soreness in his L posterior neck.    Home Medications Prior to Admission medications   Medication Sig Start Date End Date Taking? Authorizing Provider  verapamil (CALAN-SR) 120 MG CR tablet Take 120 mg by mouth daily. Take 2 pills a day 11/17/22  Yes [provider]  amLODipine (NORVASC) 10 MG tablet Take 10 mg by mouth at bedtime.    [provider]  atorvastatin (LIPITOR) 80 MG tablet Take 1 tablet (80 mg total) by mouth daily. 03/23/22   Little Ishikawa, MD  Garlic 10 MG CAPS Take by mouth.    [provider]  Hawthorn 150 MG CAPS Take by mouth.    [provider]  lisinopril (ZESTRIL) 10 MG tablet Take 10 mg by mouth daily.    [provider]  Multiple Vitamins-Minerals (MENS 50+ MULTI VITAMIN/MIN PO) Take 1 tablet by mouth daily.    [provider]  Omega-3 Fatty Acids (FISH OIL) 1000 MG CAPS Take 2,000 mg by mouth daily.    [provider]     Allergies    Lexapro [escitalopram]   Review of Systems   Review of Systems Please see HPI for pertinent positives and negatives  Physical Exam BP (!) 163/84   Pulse 64   Temp (!) 97.5 F (36.4 C) (Oral)   Resp (!) 26   Ht 5\' 8"   (1.727 m)   Wt 82.3 kg   SpO2 94%   BMI 27.60 kg/m   Physical Exam Vitals and nursing note reviewed.  Constitutional:      Appearance: Normal appearance.  HENT:     Head: Normocephalic and atraumatic.     Nose: Nose normal.     Mouth/Throat:     Mouth: Mucous membranes are moist.  Eyes:     Extraocular Movements: Extraocular movements intact.     Conjunctiva/sclera: Conjunctivae normal.  Cardiovascular:     Rate and Rhythm: Normal rate.  Pulmonary:     Effort: Pulmonary effort is normal.     Breath sounds: Normal breath sounds.  Abdominal:     General: Abdomen is flat.     Palpations: Abdomen is soft.     Tenderness: There is no abdominal tenderness.  Musculoskeletal:        General: No swelling. Normal range of motion.     Cervical back: Neck supple.  Skin:    General: Skin is warm and dry.  Neurological:     General: No focal deficit present.     Mental Status: He is alert and oriented to person, place, and time.     Cranial Nerves: No cranial nerve deficit.     Sensory: Sensory deficit (subjective  change in sensation on L arm and leg) present.     Motor: No weakness.     Coordination: Coordination normal.     Gait: Gait normal.     Deep Tendon Reflexes: Reflexes normal.     Comments: No visual field cuts. No dysarthria or aphasia.   Psychiatric:        Mood and Affect: Mood normal.     ED Results / Procedures / Treatments   EKG EKG Interpretation Date/Time:  Wednesday November 25 2022 02:50:36 EDT Ventricular Rate:  61 PR Interval:  155 QRS Duration:  88 QT Interval:  439 QTC Calculation: 443 R Axis:   -12  Text Interpretation: Sinus rhythm Low voltage, precordial leads Consider anterior infarct No significant change since last tracing Confirmed by Susy Frizzle [id number]) on 11/25/2022 3:06:05 AM  Procedures Procedures  Medications Ordered in the ED Medications  aspirin chewable tablet 324 mg (has no administration in time range)    Initial Impression  and Plan  Patient here with unilateral numbness without objective neuro deficits, however he is potentially within the window for intervention. Will activate Code Stroke and initiate order panel. Also consider carotid injury from chiropractic adjustment.   ED Course   Clinical Course as of 11/25/22 0410  Wed Nov 25, 2022  0330 Spoke with Dr. Phill Myron, Radiology. I personally viewed the images from radiology studies and agree with radiologist interpretation: CT without acute findings [CS]  0330 CBC is normal.  [CS]  0342 Spoke with Dr. Allena Katz, TeleNeurology, does not recommend thrombolytics and does not have symptoms of LVO. He recommends CTA and admit for further evaluation of TIA vs small stroke. Patient is amenable to this plan. Hospitalist paged.  [CS]  0343 Coags are neg.  [CS]  0402 CMP is unremarkable.  [CS]  [phone] Spoke with Dr. Antionette Char, Hospitalist, who will accept for admission [CS]    Clinical Course User Index [CS] Pollyann Savoy, MD     MDM Rules/Calculators/A&P Medical Decision Making Problems Addressed: TIA (transient ischemic attack): acute illness or injury  Amount and/or Complexity of Data Reviewed Labs: ordered. Decision-making details documented in ED Course. Radiology: ordered and independent interpretation performed. Decision-making details documented in ED Course. ECG/medicine tests: ordered and independent interpretation performed. Decision-making details documented in ED Course.  Risk OTC drugs. Decision regarding hospitalization.     Final Clinical Impression(s) / ED Diagnoses Final diagnoses:  TIA (transient ischemic attack)    Rx / DC Orders ED Discharge Orders     None        Pollyann Savoy, MD 11/25/22 [phone]

## 2022-11-25 NOTE — TOC Initial Note (Signed)
Transition of Care [facility]) - Initial/Assessment Note    Patient Details  Name: Randall Lane MRN: [id number] Date of Birth: 1946-02-20  [facility] CM/SW Contact:    Gordy Clement, RN Phone Number: 11/25/2022, 3:34 PM  Clinical Narrative:       CM met with patient to complete initial assessment.  Patient is from home with Wife and very independent .  He mentioned being very active out doors and caring for his 2 acre property.  He does not own any DME.  Patient has an established PCP : Joycelyn Rua, MD and is insured through [facility] HMO/PPO.   PT/OT recs are pending. TOC will continue to follow patient for any additional discharge needs        Patient Goals and CMS Choice Patient states their goals for this hospitalization and ongoing recovery are:: go home CMS Medicare.gov Compare Post Acute Care list provided to:: Other (Comment Required) (No Recs yet) Choice offered to / list presented to : NA      Expected Discharge Plan and Services In-house Referral: NA Discharge Planning Services: NA Post Acute Care Choice: NA Living arrangements for the past 2 months: Single Family Home                 DME Arranged: N/A DME Agency: NA       HH Arranged: NA HH Agency: NA        Prior Living Arrangements/Services Living arrangements for the past 2 months: Single Family Home Lives with:: Spouse Patient language and need for interpreter reviewed:: Yes Do you feel safe going back to the place where you live?: Yes      Need for Family Participation in Patient Care: No (Comment) Care giver support system in place?: No (comment) Current home services: Other (comment) (NONE) Criminal Activity/Legal Involvement Pertinent to Current Situation/Hospitalization: No - Comment as needed  Activities of Daily Living Home Assistive Devices/Equipment: None ADL Screening (condition at time of admission) Patient's cognitive ability adequate to safely complete daily  activities?: Yes Is the patient deaf or have difficulty hearing?: No Does the patient have difficulty seeing, even when wearing glasses/contacts?: No Does the patient have difficulty concentrating, remembering, or making decisions?: No Patient able to express need for assistance with ADLs?: Yes Does the patient have difficulty dressing or bathing?: No Independently performs ADLs?: Yes (appropriate for developmental age) Does the patient have difficulty walking or climbing stairs?: No Weakness of Legs: None Weakness of Arms/Hands: None  Permission Sought/Granted Permission sought to share information with : Case Manager Permission granted to share information with : Yes, Verbal Permission Granted        Permission granted to share info w Relationship: CM     Emotional Assessment Appearance:: Appears younger than stated age Attitude/Demeanor/Rapport: Gracious Affect (typically observed): Pleasant Orientation: : Oriented to Self, Oriented to Place, Oriented to  Time, Oriented to Situation Alcohol / Substance Use: Not Applicable Psych Involvement: No (comment)  Admission diagnosis:  TIA (transient ischemic attack) [G45.9] Left sided numbness [R20.0] Patient Active Problem List   Diagnosis Date Noted   Left sided numbness 11/25/2022   Hypertensive urgency 05/11/2018   Essential hypertension 05/11/2018   Flushing 05/11/2018   Hyperlipidemia 05/11/2018   PCP:  Joycelyn Rua, MD Pharmacy:   [facility] [address] [place] - [address] [place] [address] Phone: [phone] Fax: [phone]     Social Determinants of Health (SDOH) Social History: SDOH  Screenings   Food Insecurity: No Food Insecurity (11/25/2022)  Housing: Low Risk  (11/25/2022)  Transportation Needs: No Transportation Needs (11/25/2022)  Utilities: Not At Risk (11/25/2022)  Financial Resource Strain: Low Risk  (05/11/2018)  Physical Activity: Unknown  (05/11/2018)  Social Connections: Socially Integrated (05/11/2018)  Stress: No Stress Concern Present (05/11/2018)  Tobacco Use: Low Risk  (11/25/2022)   SDOH Interventions:     Readmission Risk Interventions     No data to display

## 2022-11-25 NOTE — ED Notes (Signed)
Patient transported to CT 

## 2022-11-25 NOTE — H&P (Signed)
History and Physical   Randall Lane:[id number] DOB: 04-May-1946 DOA: 11/25/2022  PCP: Joycelyn Rua, MD   Patient coming from: Home  Chief Complaint: Numbness  HPI: Randall Lane is a 77 y.o. male with medical history significant of hypertension, hyperlipidemia presenting with new onset left-sided numbness.  Patient reports that he went to sleep around 10:30 PM and felt normal and woke up around 12:30 AM and noted that he felt unusual sensation / numbness of left face, arm, leg.  No other focal deficits noted.  His symptoms not improved so he came to the ED for further evaluation.  He does note that he had a chiropractor adjustment yesterday but felt normal following this.  He denies fevers, chills, chest pain, shortness of breath, abdominal pain, constipation, diarrhea, nausea, vomiting.  ED Course: Vital signs in the ED notable for blood pressure in the 140s to 160s systolic, heart rate in the 50s to 60s.  Lab workup included CMP with glucose 142, calcium 8.8.  CBC within normal limits.,  PT, PTT, INR within normal limits.  Ethanol level negative.  UDS negative.  Urinalysis negative.  Troponin normal x 2.  Chest x-ray showed no acute normality.  CT head showed no acute abnormality.  CTA head and neck showed no acute abnormality, did note mild atherosclerosis and some asymmetric otitis with possible need for ENT follow-up if new.  Teleneurology consulted in the ED.  Recommended TIA/CVA workup and aspirin and statin.  Also recommended additional labs.  Review of Systems: As per HPI otherwise all other systems reviewed and are negative.  Past Medical History:  Diagnosis Date   Hyperlipidemia    Hypertension     History reviewed. No pertinent surgical history.  Social History  reports that he has never smoked. He has never used smokeless tobacco. He reports current alcohol use. He reports that he does not use drugs.  Allergies  Allergen Reactions   Lexapro [Escitalopram]  Other (See Comments)    History reviewed. No pertinent family history.   Prior to Admission medications   Medication Sig Start Date End Date Taking? Authorizing Provider  atorvastatin (LIPITOR) 80 MG tablet Take 1 tablet (80 mg total) by mouth daily. 03/23/22  Yes Little Ishikawa, MD  Garlic 10 MG CAPS Take 1 capsule by mouth daily.   Yes [provider]  lisinopril (ZESTRIL) 20 MG tablet Take 20 mg by mouth 2 (two) times daily. 11/09/22  Yes [provider]  MAGNESIUM PO Take 1 capsule by mouth at bedtime.   Yes [provider]  Multiple Vitamins-Minerals (MENS 50+ MULTI VITAMIN/MIN PO) Take 1 tablet by mouth daily.   Yes [provider]  Omega-3 Fatty Acids (FISH OIL) 1000 MG CAPS Take 1,000 mg by mouth daily.   Yes [provider]  triamcinolone cream (KENALOG) 0.1 % Apply 1 Application topically as needed (flare ups). 09/07/22  Yes [provider]  verapamil (CALAN-SR) 120 MG CR tablet Take 120 mg by mouth 2 (two) times daily. 11/17/22  Yes [provider]    Physical Exam: Vitals:   11/25/22 1108 11/25/22 1120 11/25/22 1130 11/25/22 1238  BP: (!) 151/90 (!) 157/103 (!) 152/92 (!) 168/88  Pulse: 76  73 74  Resp: 20  20 15   Temp: 98.2 F (36.8 C)   98.4 F (36.9 C)  TempSrc: Oral   Oral  SpO2: 99%  96% 99%  Weight:    81.7 kg  Height:    5\' 8"  (1.727  m)    Physical Exam Constitutional:      General: He is not in acute distress.    Appearance: Normal appearance.  HENT:     Head: Normocephalic and atraumatic.     Mouth/Throat:     Mouth: Mucous membranes are moist.     Pharynx: Oropharynx is clear.  Eyes:     Extraocular Movements: Extraocular movements intact.     Pupils: Pupils are equal, round, and reactive to light.  Cardiovascular:     Rate and Rhythm: Normal rate and regular rhythm.     Pulses: Normal pulses.     Heart sounds: Normal heart sounds.  Pulmonary:     Effort: Pulmonary effort is  normal. No respiratory distress.     Breath sounds: Normal breath sounds.  Abdominal:     General: Bowel sounds are normal. There is no distension.     Palpations: Abdomen is soft.     Tenderness: There is no abdominal tenderness.  Musculoskeletal:        General: No swelling or deformity.  Skin:    General: Skin is warm and dry.  Neurological:     Comments: Mental Status: Patient is awake, alert, oriented x3 No signs of aphasia or neglect Cranial Nerves: II: Pupils equal, round, and reactive to light.   III,IV, VI: EOMI without ptosis or diploplia.  V: Facial sensation is symmetric to light touch. VII: Facial movement is symmetric.  VIII: hearing is intact to voice X: Uvula elevates symmetrically XI: Shoulder shrug is symmetric. XII: tongue is midline without atrophy or fasciculations.  Motor: Good effort thorughout, at Least 5/5 bilateral UE, 5/5 bilateral lower extremitiy  Sensory: Sensation is grossly intact bilateral UEs & LEs Cerebellar: Finger-Nose intact bilalat    Labs on Admission: I have personally reviewed following labs and imaging studies  CBC: Recent Labs  Lab 11/25/22 0324  WBC 7.4  NEUTROABS 5.3  HGB 14.9  HCT 44.1  MCV 90.0  PLT 239    Basic Metabolic Panel: Recent Labs  Lab 11/25/22 0324  NA 137  K 4.1  CL 103  CO2 25  GLUCOSE 142*  BUN 16  CREATININE 1.15  CALCIUM 8.8*    GFR: Estimated Creatinine Clearance: 53.7 mL/min (by C-G formula based on SCr of 1.15 mg/dL).  Liver Function Tests: Recent Labs  Lab 11/25/22 0324  AST 20  ALT 19  ALKPHOS 103  BILITOT 0.5  PROT 6.9  ALBUMIN 3.8    Urine analysis:    Component Value Date/Time   COLORURINE YELLOW 11/25/2022 0427   APPEARANCEUR CLEAR 11/25/2022 0427   LABSPEC 1.020 11/25/2022 0427   PHURINE 7.0 11/25/2022 0427   GLUCOSEU NEGATIVE 11/25/2022 0427   HGBUR NEGATIVE 11/25/2022 0427   BILIRUBINUR NEGATIVE 11/25/2022 0427   KETONESUR NEGATIVE 11/25/2022 0427   PROTEINUR  NEGATIVE 11/25/2022 0427   NITRITE NEGATIVE 11/25/2022 0427   LEUKOCYTESUR NEGATIVE 11/25/2022 0427    Radiological Exams on Admission: DG Chest Portable 1 View  Result Date: 11/25/2022 CLINICAL DATA:  Left chest pain radiating to the left arm. Stiffness in the left neck. EXAM: PORTABLE CHEST 1 VIEW COMPARISON:  11/10/2020 FINDINGS: The lungs appear clear. Thoracic spondylosis noted. Currently heart size is within normal limits. No blunting of the costophrenic angles. No additional significant findings. IMPRESSION: 1. No acute findings. 2. Thoracic spondylosis. Electronically Signed   By: Gaylyn Rong M.D.   On: 11/25/2022 09:28   CT ANGIO HEAD NECK W WO CM  Result Date: 11/25/2022  CLINICAL DATA:  Left-sided facial, arm, and leg numbness. EXAM: CT ANGIOGRAPHY HEAD AND NECK WITH AND WITHOUT CONTRAST TECHNIQUE: Multidetector CT imaging of the head and neck was performed using the standard protocol during bolus administration of intravenous contrast. Multiplanar CT image reconstructions and MIPs were obtained to evaluate the vascular anatomy. Carotid stenosis measurements (when applicable) are obtained utilizing NASCET criteria, using the distal internal carotid diameter as the denominator. RADIATION DOSE REDUCTION: This exam was performed according to the departmental dose-optimization program which includes automated exposure control, adjustment of the mA and/or kV according to patient size and/or use of iterative reconstruction technique. CONTRAST:  75mL OMNIPAQUE IOHEXOL 350 MG/ML SOLN COMPARISON:  Head CT from earlier the same day FINDINGS: CTA NECK FINDINGS Aortic arch: Atheromatous plaque with 2 vessel branching. Right carotid system: Mild atheromatous plaque at the bifurcation. No stenosis or ulceration. Left carotid system: Mild atheromatous plaque at the bifurcation. No stenosis or ulceration. Vertebral arteries: No proximal subclavian stenosis. The left vertebral artery is strongly dominant.  Vertebral arteries are smoothly contoured and widely patent to the dura. Skeleton: No acute or aggressive finding Other neck: Asymmetric glottis with medial fold on the right Upper chest: Clear apical lungs Review of the MIP images confirms the above findings CTA HEAD FINDINGS Anterior circulation: Atheromatous plaque along the carotid siphons. No branch occlusion, beading, or aneurysm. Posterior circulation: Left dominant vertebral artery. Mild atheromatous calcification along the vertebral arteries. No branch occlusion, beading, or aneurysm. Venous sinuses: Diffusely patent Anatomic variants: None significant Review of the MIP images confirms the above findings IMPRESSION: 1. No emergent finding. 2. Mild for age atherosclerosis without flow limiting stenosis or ulceration of major arteries in the head and neck. 3. Asymmetric glottis with possible paresis on the right, suggests ENT referral if this is a new finding. Electronically Signed   By: Tiburcio Pea M.D.   On: 11/25/2022 04:52   CT HEAD CODE STROKE WO CONTRAST  Result Date: 11/25/2022 CLINICAL DATA:  Code stroke. Initial evaluation for neuro deficit, stroke., EXAM: CT HEAD WITHOUT CONTRAST TECHNIQUE: Contiguous axial images were obtained from the base of the skull through the vertex without intravenous contrast. RADIATION DOSE REDUCTION: This exam was performed according to the departmental dose-optimization program which includes automated exposure control, adjustment of the mA and/or kV according to patient size and/or use of iterative reconstruction technique. COMPARISON:  None Available. FINDINGS: Brain: Generalized age-related cerebral atrophy. Patchy hypodensity involving the supratentorial cerebral white matter, most characteristic of chronic microvascular ischemic disease, moderate in nature. No acute intracranial hemorrhage. No acute large vessel territory infarct. No mass lesion or midline shift. No hydrocephalus or extra-axial fluid  collection. Vascular: No abnormal hyperdense vessel. Calcified atherosclerosis present at the skull base. Skull: Scalp soft tissues demonstrate no acute finding. Calvarium intact. Sinuses/Orbits: Globes orbital soft tissues within normal limits. Paranasal sinuses are clear. No mastoid effusion. Other: None. ASPECTS [facility] Stroke Program Early CT Score) - Ganglionic level infarction (caudate, lentiform nuclei, internal capsule, insula, M1-M3 cortex): 7 - Supraganglionic infarction (M4-M6 cortex): 3 Total score (0-10 with 10 being normal): 10 IMPRESSION: 1. No acute intracranial abnormality 2. ASPECTS is ten. 3. Generalized age-related cerebral atrophy with moderate chronic microvascular ischemic disease. Critical Value/emergent results were called by telephone at the time of interpretation on 11/25/2022 at 3:23 am to provider [facility] , who verbally acknowledged these results. Results Electronically Signed   By: Rise Mu M.D.   On: 11/25/2022 03:25    EKG: Independently reviewed.  Sinus  rhythm at 61 bpm.  Low voltage multiple leads.  Nonspecific T wave flattening.  Assessment/Plan Principal Problem:   Left sided numbness Active Problems:   Essential hypertension   Hyperlipidemia   Left-sided numbness > Patient presenting with left-sided numbness noticed when he woke up around 12:30 AM after being normal when he went to sleep around 2:30 PM.  Symptoms persisted and he presented to the ED for further evaluation. > Did have chiropractic adjustment yesterday but felt normal afterwards. > No history of previous.  Telemetry neurology consulted recommending TIA/CVA workup. > CT head without acute abnormality.  CTA head and neck showed no acute emergent abnormality.  Did show mild atherosclerosis.  Also showed asymmetrical tinnitus with consideration of ENT follow-up as needed. - Inpatient neurology consult - Allow for permissive HTN (systolic < 220 and diastolic < 120)  - Daily  aspirin - Continue home statin  - Echocardiogram  - A1C  - Lipid panel  - Tele monitoring  - SLP eval - PT/OT  HTN - Permissive HTN as above  HLD - Continue home statin  DVT prophylaxis: SCDs for now Code Status:   Full Family Communication:  None on admission  Disposition Plan:   Patient is from:  Home  Anticipated DC to:  Home  Anticipated DC date:  1 to 2 days  Anticipated DC barriers: None  Consults called:  Neurology Admission status:  Observation, telemetry  Severity of Illness: The appropriate patient status for this patient is OBSERVATION. Observation status is judged to be reasonable and necessary in order to provide the required intensity of service to ensure the patient's safety. The patient's presenting symptoms, physical exam findings, and initial radiographic and laboratory data in the context of their medical condition is felt to place them at decreased risk for further clinical deterioration. Furthermore, it is anticipated that the patient will be medically stable for discharge from the hospital within 2 midnights of admission.    Synetta Fail MD [facility]  How to contact the [facility] Attending or Consulting provider 7A - 7P or covering provider during after hours 7P -7A, for this patient?   Check the care team in [facility] and look for a) attending/consulting [facility] provider listed and b) the [facility] team listed Log into [email or web address] and use [place]'s universal password to access. If you do not have the password, please contact the hospital operator. Locate the [facility] provider you are looking for under [facility] and page to a number that you can be directly reached. If you still have difficulty reaching the provider, please page the [facility] (Director on Call) for the Hospitalists listed on amion for assistance.  11/25/2022, 1:46 PM

## 2022-11-25 NOTE — ED Triage Notes (Addendum)
Pt reports he woke up at 12:38am stating he "felt weird"; pt endorses numbness to the LT face, LT arm, and to the LT leg. Pt reports he would feel better when he would get up, but he would get worse when he laid down. Denies weakness; it feels "more like soreness", but it is numb. No slurred speech. No hx of stroke. States he has had this happen before and it was his BP being abnormal. NIHSS 1 in triage

## 2022-11-25 NOTE — Progress Notes (Signed)
OT Cancellation Note  Patient Details Name: Randall Lane MRN: [id number] DOB: 1945-08-08   Cancelled Treatment:    Reason Eval/Treat Not Completed: OT screened, no needs identified, will sign off (Per discussion with PT, pt's symptoms have resolved, mobilizing and completing ADLs independently, back at baseline. OT will screen, please reconsult if there is a change in pt status.)  Carver Fila, OTD, OTR/L SecureChat Preferred Acute Rehab [phone]   Dalphine Handing 11/25/2022, 5:20 PM

## 2022-11-26 ENCOUNTER — Other Ambulatory Visit (HOSPITAL_COMMUNITY): Payer: Medicare HMO

## 2022-11-26 DIAGNOSIS — R200 Anesthesia of skin: Secondary | ICD-10-CM | POA: Diagnosis not present

## 2022-11-26 LAB — LIPID PANEL
Cholesterol: 160 mg/dL (ref 0–200)
HDL: 36 mg/dL — ABNORMAL LOW (ref >40)
LDL Cholesterol: 75 mg/dL (ref 0–99)
Total CHOL/HDL Ratio: 4.4 RATIO
Triglycerides: 243 mg/dL — ABNORMAL HIGH (ref <150)
VLDL: 49 mg/dL — ABNORMAL HIGH (ref 0–40)

## 2022-11-26 LAB — HEMOGLOBIN A1C
Hgb A1c MFr Bld: 6.3 % — ABNORMAL HIGH (ref 4.8–5.6)
Mean Plasma Glucose: 134.11 mg/dL

## 2022-11-26 MED ORDER — ASPIRIN 81 MG PO TBEC: 81.0000 mg | DELAYED_RELEASE_TABLET | Freq: Every day | ORAL | 12 refills | Status: AC

## 2022-11-26 MED ORDER — EZETIMIBE 10 MG PO TABS: 10.0000 mg | ORAL_TABLET | Freq: Every day | ORAL | Status: DC

## 2022-11-26 MED ORDER — CLOPIDOGREL BISULFATE 75 MG PO TABS: 75.0000 mg | ORAL_TABLET | Freq: Every day | ORAL | Status: DC

## 2022-11-26 NOTE — Discharge Summary (Signed)
PATIENT DETAILS Name: Randall Lane Age: 77 y.o. Sex: male Date of Birth: 08-Jan-1946 MRN: [id number]. Admitting Physician: Randall Deutscher, MD MVH:QIONGE, Randall Senior, MD  Admit Date: 11/25/2022 Discharge date: 11/26/2022  Recommendations for Outpatient Follow-up:  Follow up with PCP in 1-2 weeks Please obtain CMP/CBC in one week Please ensure follow-up with neurology.    Admitted From:  Home  Disposition: Home   Discharge Condition: good  CODE STATUS:   Code Status: Full Code   Diet recommendation:  Diet Order             Diet - low sodium heart healthy           Diet regular Fluid consistency: Thin  Diet effective now                    Brief Summary: 77 year old with history of HTN, HLD who presented with left-sided numbness-thought to have TIA and subsequently admitted to the hospitalist service.  Significant studies 7/3>> MRI brain: No CVA 7/3>> CTA head/neck: No LVO/stenosis 7/4>> A1c: 6.3 7/4>> LDL: 75 7/4 >>echo:  Brief Hospital Course: Left-sided sided paresthesia of unknown etiology Initially thought to have TIA-but after discussion with stroke MD/neurology-not felt to have TIA-patient had gone to the chiropractor a day or so before this hospitalization perhaps related to chiropractic manipulation.  His symptoms have completely resolved-and have not reoccurred since hospitalization yesterday. Dr. Pearlean Lane recommends aspirin alone.  No need to pursue echocardiogram-discussed with patient-if he does decide to complete workup-he could talk to his primary care practitioner and get outpatient echo done..  Patient is agreeable-and wants to go home today.    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  Discharge Diagnoses:  Principal  Problem:   Left sided numbness Active Problems:   Essential hypertension   Hyperlipidemia   Discharge Instructions:  Activity:  As tolerated   Discharge Instructions     Ambulatory referral to Neurology   Complete by: As directed    An appointment is requested in approximately: 8 weeks   Call MD for:  difficulty breathing, headache or visual disturbances   Complete by: As directed    Call MD for:  persistant dizziness or light-headedness   Complete by: As directed    Diet - low sodium heart healthy   Complete by: As directed    Discharge instructions   Complete by: As directed    Follow with Primary MD  Randall Rua, MD in 1-2 weeks  Incidental finding on CT angio head/neck: Asymmetric glottis-he did not appear to have any major symptoms from this-however you will need direct visualization by ENT, this can be done in the outpatient setting.  Please look at the discharge paperwork-and please call [facility] sometime next week.    You will get a call from neurology office for a follow-up appointment-if you do not hear from them-please give them a call.  Please get a complete blood count and chemistry panel checked by your Primary MD at your next visit, and again as instructed by your Primary MD.  Get Medicines reviewed and adjusted: Please take all your medications with you for your next visit with your Primary MD  Laboratory/radiological data: Please request your Primary MD to go over all hospital tests and procedure/radiological results at the follow up, please ask your Primary MD  to get all Hospital records sent to his/her office.  In some cases, they will be blood work, cultures and biopsy results pending at the time of your discharge. Please request that your primary care M.D. follows up on these results.  Also Note the following: If you experience worsening of your admission symptoms, develop shortness of breath, life threatening emergency, suicidal or homicidal  thoughts you must seek medical attention immediately by calling 911 or calling your MD immediately  if symptoms less severe.  You must read complete instructions/literature along with all the possible adverse reactions/side effects for all the Medicines you take and that have been prescribed to you. Take any new Medicines after you have completely understood and accpet all the possible adverse reactions/side effects.   Do not drive when taking Pain medications or sleeping medications (Benzodaizepines)  Do not take more than prescribed Pain, Sleep and Anxiety Medications. It is not advisable to combine anxiety,sleep and pain medications without talking with your primary care practitioner  Special Instructions: If you have smoked or chewed Tobacco  in the last 2 yrs please stop smoking, stop any regular Alcohol  and or any Recreational drug use.  Wear Seat belts while driving.  Please note: You were cared for by a hospitalist during your hospital stay. Once you are discharged, your primary care physician will handle any further medical issues. Please note that NO REFILLS for any discharge medications will be authorized once you are discharged, as it is imperative that you return to your primary care physician (or establish a relationship with a primary care physician if you do not have one) for your post hospital discharge needs so that they can reassess your need for medications and monitor your lab values.   Increase activity slowly   Complete by: As directed       Allergies as of 11/26/2022       Reactions   Lexapro [escitalopram] Other (See Comments)        Medication List     TAKE these medications    aspirin EC 81 MG tablet Take 1 tablet (81 mg total) by mouth daily. Swallow whole. Start taking on: November 27, 2022   atorvastatin 80 MG tablet Commonly known as: LIPITOR Take 1 tablet (80 mg total) by mouth daily.   Fish Oil 1000 MG Caps Take 1,000 mg by mouth daily.   Garlic  10 MG Caps Take 1 capsule by mouth daily.   lisinopril 20 MG tablet Commonly known as: ZESTRIL Take 20 mg by mouth 2 (two) times daily.   MAGNESIUM PO Take 1 capsule by mouth at bedtime.   MENS 50+ MULTI VITAMIN/MIN PO Take 1 tablet by mouth daily.   triamcinolone cream 0.1 % Commonly known as: KENALOG Apply 1 Application topically as needed (flare ups).   verapamil 120 MG CR tablet Commonly known as: CALAN-SR Take 120 mg by mouth 2 (two) times daily.        Follow-up Information     Randall Rua, MD. Schedule an appointment as soon as possible for a visit in 1 week(s).   Specialty: Family Medicine Contact information: [address] [place] [address] [phone]         Arsenio Loader, [facility]. Schedule an appointment as soon as possible for a visit in 2 week(s).   Contact information: [address] [place] [address] [place] [address] [phone]         [facility] Follow  up.   Specialty: Neurology Why: Office will call with date/time, If you dont hear from them,please give them a call Contact information: [address] [place] [address] [phone]               Allergies  Allergen Reactions   Lexapro [Escitalopram] Other (See Comments)     Other Procedures/Studies: MR BRAIN WO CONTRAST  Result Date: 11/25/2022 CLINICAL DATA:  Neuro deficit, acute, stroke suspected. Left-sided numbness. EXAM: MRI HEAD WITHOUT CONTRAST TECHNIQUE: Multiplanar, multiecho pulse sequences of the brain and surrounding structures were obtained without intravenous contrast. COMPARISON:  None Available. FINDINGS: Brain: No acute infarction, hemorrhage, hydrocephalus, extra-axial collection or mass lesion. 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 Scattered confluent foci of T2 hyperintensity are seen within the white matter of the cerebral hemispheres, nonspecific, most likely  related to chronic small vessel ischemia. Moderate parenchymal volume loss. Vascular: Normal flow voids. Skull and upper cervical spine: Normal marrow signal. Sinuses/Orbits: Bilateral lens surgery.  Paranasal are clear. Other: None. IMPRESSION: 1. No acute intracranial abnormality. 2. Moderate chronic microvascular ischemic changes of the white matter. 3. 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 4. Moderate parenchymal volume loss. Electronically Signed   By: Baldemar Lenis M.D.   On: 11/25/2022 16:41   DG Chest Portable 1 View  Result Date: 11/25/2022 CLINICAL DATA:  Left chest pain radiating to the left arm. Stiffness in the left neck. EXAM: PORTABLE CHEST 1 VIEW COMPARISON:  11/10/2020 FINDINGS: The lungs appear clear. Thoracic spondylosis noted. Currently heart size is within normal limits. No blunting of the costophrenic angles. No additional significant findings. IMPRESSION: 1. No acute findings. 2. Thoracic spondylosis. Electronically Signed   By: Gaylyn Rong M.D.   On: 11/25/2022 09:28   CT ANGIO HEAD NECK W WO CM  Result Date: 11/25/2022 CLINICAL DATA:  Left-sided facial, arm, and leg numbness. EXAM: CT ANGIOGRAPHY HEAD AND NECK WITH AND WITHOUT CONTRAST TECHNIQUE: Multidetector CT imaging of the head and neck was performed using the standard protocol during bolus administration of intravenous contrast. Multiplanar CT image reconstructions and MIPs were obtained to evaluate the vascular anatomy. Carotid stenosis measurements (when applicable) are obtained utilizing NASCET criteria, using the distal internal carotid diameter as the denominator. RADIATION DOSE REDUCTION: This exam was performed according to the departmental dose-optimization program which includes automated exposure control, adjustment of the mA and/or kV according to patient size and/or use of iterative reconstruction technique. CONTRAST:  75mL OMNIPAQUE IOHEXOL 350 MG/ML SOLN COMPARISON:  Head CT from  earlier the same day FINDINGS: CTA NECK FINDINGS Aortic arch: Atheromatous plaque with 2 vessel branching. Right carotid system: Mild atheromatous plaque at the bifurcation. No stenosis or ulceration. Left carotid system: Mild atheromatous plaque at the bifurcation. No stenosis or ulceration. Vertebral arteries: No proximal subclavian stenosis. The left vertebral artery is strongly dominant. Vertebral arteries are smoothly contoured and widely patent to the dura. Skeleton: No acute or aggressive finding Other neck: Asymmetric glottis with medial fold on the right Upper chest: Clear apical lungs Review of the MIP images confirms the above findings CTA HEAD FINDINGS Anterior circulation: Atheromatous plaque along the carotid siphons. No branch occlusion, beading, or aneurysm. Posterior circulation: Left dominant vertebral artery. Mild atheromatous calcification along the vertebral arteries. No branch occlusion, beading, or aneurysm. Venous sinuses: Diffusely patent Anatomic variants: None significant Review of the MIP images confirms the above findings IMPRESSION: 1. No emergent finding. 2. Mild for age atherosclerosis without flow limiting  stenosis or ulceration of major arteries in the head and neck. 3. Asymmetric glottis with possible paresis on the right, suggests ENT referral if this is a new finding. Electronically Signed   By: Tiburcio Pea M.D.   On: 11/25/2022 04:52   CT HEAD CODE STROKE WO CONTRAST  Result Date: 11/25/2022 CLINICAL DATA:  Code stroke. Initial evaluation for neuro deficit, stroke., EXAM: CT HEAD WITHOUT CONTRAST TECHNIQUE: Contiguous axial images were obtained from the base of the skull through the vertex without intravenous contrast. RADIATION DOSE REDUCTION: This exam was performed according to the departmental dose-optimization program which includes automated exposure control, adjustment of the mA and/or kV according to patient size and/or use of iterative reconstruction technique.  COMPARISON:  None Available. FINDINGS: Brain: Generalized age-related cerebral atrophy. Patchy hypodensity involving the supratentorial cerebral white matter, most characteristic of chronic microvascular ischemic disease, moderate in nature. No acute intracranial hemorrhage. No acute large vessel territory infarct. No mass lesion or midline shift. No hydrocephalus or extra-axial fluid collection. Vascular: No abnormal hyperdense vessel. Calcified atherosclerosis present at the skull base. Skull: Scalp soft tissues demonstrate no acute finding. Calvarium intact. Sinuses/Orbits: Globes orbital soft tissues within normal limits. Paranasal sinuses are clear. No mastoid effusion. Other: None. ASPECTS [facility] Stroke Program Early CT Score) - Ganglionic level infarction (caudate, lentiform nuclei, internal capsule, insula, M1-M3 cortex): 7 - Supraganglionic infarction (M4-M6 cortex): 3 Total score (0-10 with 10 being normal): 10 IMPRESSION: 1. No acute intracranial abnormality 2. ASPECTS is ten. 3. Generalized age-related cerebral atrophy with moderate chronic microvascular ischemic disease. Critical Value/emergent results were called by telephone at the time of interpretation on 11/25/2022 at 3:23 am to provider [facility] , who verbally acknowledged these results. Results Electronically Signed   By: Rise Mu M.D.   On: 11/25/2022 03:25     TODAY-DAY OF DISCHARGE:  Subjective:   Randall Lane today has no headache,no chest abdominal pain,no new weakness tingling or numbness, feels much better wants to go home today.   Objective:   Blood pressure (!) 154/80, pulse 81, temperature 98.7 F (37.1 C), temperature source Oral, resp. rate 16, height 5\' 8"  (1.727 m), weight 81.7 kg, SpO2 95 %.  Intake/Output Summary (Last 24 hours) at 11/26/2022 1325 Last data filed at 11/26/2022 0930 Gross per 24 hour  Intake 360 ml  Output --  Net 360 ml   Filed Weights   11/25/22 0250 11/25/22 1238  Weight:  82.3 kg 81.7 kg    Exam: Awake Alert, Oriented *3, No new F.N deficits, Normal affect [place].AT,PERRAL Supple Neck,No JVD, No cervical lymphadenopathy appriciated.  Symmetrical Chest wall movement, Good air movement bilaterally, CTAB RRR,No Gallops,Rubs or new Murmurs, No Parasternal Heave +ve B.Sounds, Abd Soft, Non tender, No organomegaly appriciated, No rebound -guarding or rigidity. No Cyanosis, Clubbing or edema, No new Rash or bruise   PERTINENT RADIOLOGIC STUDIES: MR BRAIN WO CONTRAST  Result Date: 11/25/2022 CLINICAL DATA:  Neuro deficit, acute, stroke suspected. Left-sided numbness. EXAM: MRI HEAD WITHOUT CONTRAST TECHNIQUE: Multiplanar, multiecho pulse sequences of the brain and surrounding structures were obtained without intravenous contrast. COMPARISON:  None Available. FINDINGS: Brain: No acute infarction, hemorrhage, hydrocephalus, extra-axial collection or mass lesion. 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 Scattered confluent foci of T2 hyperintensity are seen within the white matter of the cerebral hemispheres, nonspecific, most likely related to chronic small vessel ischemia. Moderate parenchymal volume loss. Vascular: Normal flow voids. Skull and upper cervical spine: Normal marrow signal. Sinuses/Orbits: Bilateral lens surgery.  Paranasal are clear. Other: None. IMPRESSION: 1. No acute intracranial abnormality. 2. Moderate chronic microvascular ischemic changes of the white matter. 3. 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 4. Moderate parenchymal volume loss. Electronically Signed   By: Baldemar Lenis M.D.   On: 11/25/2022 16:41   DG Chest Portable 1 View  Result Date: 11/25/2022 CLINICAL DATA:  Left chest pain radiating to the left arm. Stiffness in the left neck. EXAM: PORTABLE CHEST 1 VIEW COMPARISON:  11/10/2020 FINDINGS: The lungs appear clear. Thoracic spondylosis noted. Currently heart size is within normal limits. No blunting of the costophrenic  angles. No additional significant findings. IMPRESSION: 1. No acute findings. 2. Thoracic spondylosis. Electronically Signed   By: Gaylyn Rong M.D.   On: 11/25/2022 09:28   CT ANGIO HEAD NECK W WO CM  Result Date: 11/25/2022 CLINICAL DATA:  Left-sided facial, arm, and leg numbness. EXAM: CT ANGIOGRAPHY HEAD AND NECK WITH AND WITHOUT CONTRAST TECHNIQUE: Multidetector CT imaging of the head and neck was performed using the standard protocol during bolus administration of intravenous contrast. Multiplanar CT image reconstructions and MIPs were obtained to evaluate the vascular anatomy. Carotid stenosis measurements (when applicable) are obtained utilizing NASCET criteria, using the distal internal carotid diameter as the denominator. RADIATION DOSE REDUCTION: This exam was performed according to the departmental dose-optimization program which includes automated exposure control, adjustment of the mA and/or kV according to patient size and/or use of iterative reconstruction technique. CONTRAST:  75mL OMNIPAQUE IOHEXOL 350 MG/ML SOLN COMPARISON:  Head CT from earlier the same day FINDINGS: CTA NECK FINDINGS Aortic arch: Atheromatous plaque with 2 vessel branching. Right carotid system: Mild atheromatous plaque at the bifurcation. No stenosis or ulceration. Left carotid system: Mild atheromatous plaque at the bifurcation. No stenosis or ulceration. Vertebral arteries: No proximal subclavian stenosis. The left vertebral artery is strongly dominant. Vertebral arteries are smoothly contoured and widely patent to the dura. Skeleton: No acute or aggressive finding Other neck: Asymmetric glottis with medial fold on the right Upper chest: Clear apical lungs Review of the MIP images confirms the above findings CTA HEAD FINDINGS Anterior circulation: Atheromatous plaque along the carotid siphons. No branch occlusion, beading, or aneurysm. Posterior circulation: Left dominant vertebral artery. Mild atheromatous  calcification along the vertebral arteries. No branch occlusion, beading, or aneurysm. Venous sinuses: Diffusely patent Anatomic variants: None significant Review of the MIP images confirms the above findings IMPRESSION: 1. No emergent finding. 2. Mild for age atherosclerosis without flow limiting stenosis or ulceration of major arteries in the head and neck. 3. Asymmetric glottis with possible paresis on the right, suggests ENT referral if this is a new finding. Electronically Signed   By: Tiburcio Pea M.D.   On: 11/25/2022 04:52   CT HEAD CODE STROKE WO CONTRAST  Result Date: 11/25/2022 CLINICAL DATA:  Code stroke. Initial evaluation for neuro deficit, stroke., EXAM: CT HEAD WITHOUT CONTRAST TECHNIQUE: Contiguous axial images were obtained from the base of the skull through the vertex without intravenous contrast. RADIATION DOSE REDUCTION: This exam was performed according to the departmental dose-optimization program which includes automated exposure control, adjustment of the mA and/or kV according to patient size and/or use of iterative reconstruction technique. COMPARISON:  None Available. FINDINGS: Brain: Generalized age-related cerebral atrophy. Patchy hypodensity involving the supratentorial cerebral white matter, most characteristic of chronic microvascular ischemic disease, moderate in nature. No acute intracranial hemorrhage. No acute large vessel territory infarct. No mass lesion or midline shift. No hydrocephalus or extra-axial fluid  collection. Vascular: No abnormal hyperdense vessel. Calcified atherosclerosis present at the skull base. Skull: Scalp soft tissues demonstrate no acute finding. Calvarium intact. Sinuses/Orbits: Globes orbital soft tissues within normal limits. Paranasal sinuses are clear. No mastoid effusion. Other: None. ASPECTS [facility] Stroke Program Early CT Score) - Ganglionic level infarction (caudate, lentiform nuclei, internal capsule, insula, M1-M3 cortex): 7 -  Supraganglionic infarction (M4-M6 cortex): 3 Total score (0-10 with 10 being normal): 10 IMPRESSION: 1. No acute intracranial abnormality 2. ASPECTS is ten. 3. Generalized age-related cerebral atrophy with moderate chronic microvascular ischemic disease. Critical Value/emergent results were called by telephone at the time of interpretation on 11/25/2022 at 3:23 am to provider [facility] , who verbally acknowledged these results. Results Electronically Signed   By: Rise Mu M.D.   On: 11/25/2022 03:25     PERTINENT LAB RESULTS: CBC: Recent Labs    11/25/22 0324  WBC 7.4  HGB 14.9  HCT 44.1  PLT 239   CMET CMP     Component Value Date/Time   NA 137 11/25/2022 0324   K 4.1 11/25/2022 0324   CL 103 11/25/2022 0324   CO2 25 11/25/2022 0324   GLUCOSE 142 (H) 11/25/2022 0324   BUN 16 11/25/2022 0324   CREATININE 1.15 11/25/2022 0324   CALCIUM 8.8 (L) 11/25/2022 0324   PROT 6.9 11/25/2022 0324   ALBUMIN 3.8 11/25/2022 0324   AST 20 11/25/2022 0324   ALT 19 11/25/2022 0324   ALKPHOS 103 11/25/2022 0324   BILITOT 0.5 11/25/2022 0324   GFRNONAA >60 11/25/2022 0324    GFR Estimated Creatinine Clearance: 53.7 mL/min (by C-G formula based on SCr of 1.15 mg/dL). No results for input(s): "LIPASE", "AMYLASE" in the last 72 hours. No results for input(s): "CKTOTAL", "CKMB", "CKMBINDEX", "TROPONINI" in the last 72 hours. Invalid input(s): "POCBNP" No results for input(s): "DDIMER" in the last 72 hours. Recent Labs    11/26/22 0142  HGBA1C 6.3*   Recent Labs    11/26/22 0142  CHOL 160  HDL 36*  LDLCALC 75  TRIG 161*  CHOLHDL 4.4   No results for input(s): "TSH", "T4TOTAL", "T3FREE", "THYROIDAB" in the last 72 hours.  Invalid input(s): "FREET3" No results for input(s): "VITAMINB12", "FOLATE", "FERRITIN", "TIBC", "IRON", "RETICCTPCT" in the last 72 hours. Coags: Recent Labs    11/25/22 0324  INR 1.0   Microbiology: No results found for this or any previous  visit (from the past 240 hour(s)).  FURTHER DISCHARGE INSTRUCTIONS:  Get Medicines reviewed and adjusted: Please take all your medications with you for your next visit with your Primary MD  Laboratory/radiological data: Please request your Primary MD to go over all hospital tests and procedure/radiological results at the follow up, please ask your Primary MD to get all Hospital records sent to his/her office.  In some cases, they will be blood work, cultures and biopsy results pending at the time of your discharge. Please request that your primary care M.D. goes through all the records of your hospital data and follows up on these results.  Also Note the following: If you experience worsening of your admission symptoms, develop shortness of breath, life threatening emergency, suicidal or homicidal thoughts you must seek medical attention immediately by calling 911 or calling your MD immediately  if symptoms less severe.  You must read complete instructions/literature along with all the possible adverse reactions/side effects for all the Medicines you take and that have been prescribed to you. Take any new Medicines after you have completely  understood and accpet all the possible adverse reactions/side effects.   Do not drive when taking Pain medications or sleeping medications (Benzodaizepines)  Do not take more than prescribed Pain, Sleep and Anxiety Medications. It is not advisable to combine anxiety,sleep and pain medications without talking with your primary care practitioner  Special Instructions: If you have smoked or chewed Tobacco  in the last 2 yrs please stop smoking, stop any regular Alcohol  and or any Recreational drug use.  Wear Seat belts while driving.  Please note: You were cared for by a hospitalist during your hospital stay. Once you are discharged, your primary care physician will handle any further medical issues. Please note that NO REFILLS for any discharge  medications will be authorized once you are discharged, as it is imperative that you return to your primary care physician (or establish a relationship with a primary care physician if you do not have one) for your post hospital discharge needs so that they can reassess your need for medications and monitor your lab values.  Total Time spent coordinating discharge including counseling, education and face to face time equals less than 30 minutes.  Signed: Satoshi Kalas 11/26/2022 1:25 PM

## 2022-11-26 NOTE — Progress Notes (Signed)
SLP Cancellation Note  Patient Details Name: MYRA GIACONE MRN: [id number] DOB: [birth year]   Cancelled treatment:       Reason Eval/Treat Not Completed: SLP screened chart, no needs identified, will sign off   Blenda Mounts Laurice 11/26/2022, 10:07 AM

## 2022-11-26 NOTE — Consult Note (Addendum)
Neurology Consultation Reason for Consult: transient left-sided numbness Referring Physician: Dr. Jerral Ralph  CC: transient left-sided numbness  History is obtained from: Patient and chart review  HPI: Randall Lane is a 77 y.o. male with past medical history significant for hypertension and hyperlipidemia who presented to the ED 7/3 with complaints of transient left-sided numbness. He described at the time of tightness involve posterior aspect of the neck on the left the left posterior thorax.  He denies any involvement of the left side of the face, or left upper extremity or left lower extremity.  Symptoms resolved upon admission.  Imaging negative for acute abnormalities.  She denies any headache, slurred speech, extremity weakness, gait or balance problems. There is no prior strokes, TIAs, migraines or significant neurological problems. Patient does not he had gone to a chiropractor 2 days ago.  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    Past Medical History:  Diagnosis Date   Hyperlipidemia    Hypertension     History reviewed. No pertinent family history.  Social History:  reports that he has never smoked. He has never used smokeless tobacco. He reports current alcohol use. He reports that he does not use drugs.  Exam: Current vital signs: BP (!) 154/80 (BP Location: Left Arm)   Pulse 81   Temp 98.7 F (37.1 C) (Oral)   Resp 16   Ht 5\' 8"  (1.727 m)   Wt 81.7 kg   SpO2 95%   BMI 27.39 kg/m  Vital signs in last 24 hours: Temp:  [97.6 F (36.4 C)-98.7 F (37.1 C)] 98.7 F (37.1 C) (07/04 1209) Pulse Rate:  [59-86] 81 (07/04 1209) Resp:  [15-20] 16 (07/04 1209) BP: (128-168)/(76-92) 154/80 (07/04 1209) SpO2:  [94 %-97 %] 95 % (07/04 1209)   Physical Exam  General: Pleasant, well-developed, well-nourished Caucasian male in no acute distress  Neuro: Mental Status: Patient  is awake, alert, oriented to person, place, month, year, and situation. Patient is able to give a clear and coherent history. No signs of aphasia or neglect Cranial Nerves: II: Visual Fields are full. Pupils are equal, round, and reactive to light.   III,IV, VI: EOMI without ptosis or diploplia.  V: Facial sensation is symmetric to temperature VII: Facial movement is symmetric.  VIII: hearing is intact to voice X: Uvula elevates symmetrically XI: Shoulder shrug is symmetric. XII: tongue is midline without atrophy or fasciculations.  Motor: Tone is normal. Bulk is normal. 5/5 strength was present in all four extremities.  Sensory: Sensation is symmetric to light touch and temperature in the arms and legs. Cerebellar: FNF and HKS are intact bilaterally   I have reviewed labs in epic and the results pertinent to this consultation are: LDL: 75 A1c: 6.3 UA: Negative UDS: Negative   I have reviewed the images obtained: CT head: No acute intracranial abnormality. CT angio: No emergent finding. MRI brain: No acute intracranial abnormality.  Moderate chronic microvascular ischemia.  Impression: Left-sided sided paresthesia of unknown etiology-pattern and distribution unlikely to represent stroke or TIA as not in a particular vascular distribution Initially thought to have TIA-but after exam/discussion with patient-not felt to have TIA.  His symptoms have completely resolved-and have not reoccurred since hospitalization yesterday. Possibly related to chiropractor manipulation in the recent day or two, or strenuous exercise in heat described by patient recently.   Recommendations (discussed with  attending): - Recommend aspirin alone.  No need for DAPT. - Continue Lipitor 80 mg - Follow-up with neurology in 8 weeks - Okay to discharge from neurological standpoint.   - Okay for patient to follow-up outpatient for echocardiogram.   Pt seen by Neuro NP/APP and later by MD. Note/plan to be  edited by MD as needed.    Lynnae January, DNP, AGACNP-BC [facility] Please use AMION for contact information & EPIC for messaging.  STROKE MD NOTE ; I have personally obtained history,examined this patient, reviewed notes, independently viewed imaging studies, participated in medical decision making and plan of care.ROS completed by me personally and pertinent positives fully documented  I have made any additions or clarifications directly to the above note. Agree with note above.  Patient presented with sudden onset of abnormal sensation which is difficult to describe involving posterior aspect of the left neck as well as left upper lateral thorax in a pattern of distribution which is not in the particular vascular territory.  MRI scan and CT angiogram are unremarkable.  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  Recommend aspirin and Plavix for 3 weeks followed by aspirin alone and aggressive risk factor modification.  Check echocardiogram if not possible to be done today it can be done later outpatient.  Long discussion with the patient and answered questions.  Greater than 50% time during this 50-minute visit was spent in counseling and coordination of care about his TIA-like presentation and paresthesia and answering questions.  Discussed with primary team.  Delia Heady, MD Medical Director [facility] Pager: [phone] 11/26/2022 3:13 PM

## 2022-11-26 NOTE — TOC Transition Note (Signed)
Transition of Care [facility]) - CM/SW Discharge Note   Patient Details  Name: Randall Lane MRN: [id number] Date of Birth: [birth year]  Transition of Care [facility]) CM/SW Contact:  Gordy Clement, RN Phone Number: 11/26/2022, 9:57 AM   Clinical Narrative:     Patient will dc to home. No TOC needs identified. Wife to transport        Barriers to Discharge: Continued Medical Work up   Patient Goals and CMS Choice CMS Medicare.gov Compare Post Acute Care list provided to:: Other (Comment Required) (No Recs yet) Choice offered to / list presented to : NA  Discharge Placement                         Discharge Plan and Services Additional resources added to the After Visit Summary for   In-house Referral: NA Discharge Planning Services: NA Post Acute Care Choice: NA          DME Arranged: N/A DME Agency: NA       HH Arranged: NA HH Agency: NA        Social Determinants of Health (SDOH) Interventions SDOH Screenings   Food Insecurity: No Food Insecurity (11/25/2022)  Housing: Low Risk  (11/25/2022)  Transportation Needs: No Transportation Needs (11/25/2022)  Utilities: Not At Risk (11/25/2022)  Financial Resource Strain: Low Risk  (05/11/2018)  Physical Activity: Unknown (05/11/2018)  Social Connections: Socially Integrated (05/11/2018)  Stress: No Stress Concern Present (05/11/2018)  Tobacco Use: Low Risk  (11/25/2022)     Readmission Risk Interventions     No data to display

## 2022-11-27 DIAGNOSIS — R69 Illness, unspecified: Secondary | ICD-10-CM | POA: Diagnosis not present

## 2022-12-02 DIAGNOSIS — M9901 Segmental and somatic dysfunction of cervical region: Secondary | ICD-10-CM | POA: Diagnosis not present

## 2022-12-02 DIAGNOSIS — M5411 Radiculopathy, occipito-atlanto-axial region: Secondary | ICD-10-CM | POA: Diagnosis not present

## 2022-12-02 DIAGNOSIS — M9903 Segmental and somatic dysfunction of lumbar region: Secondary | ICD-10-CM | POA: Diagnosis not present

## 2022-12-03 ENCOUNTER — Telehealth: Payer: Self-pay | Admitting: Cardiology

## 2022-12-03 DIAGNOSIS — G459 Transient cerebral ischemic attack, unspecified: Secondary | ICD-10-CM

## 2022-12-03 NOTE — Telephone Encounter (Signed)
Patient states he had an echo done back in 2021, he wants to know if Dr. Bjorn Pippin would want him to have another one done prior to his yearly f/u.

## 2022-12-03 NOTE — Telephone Encounter (Signed)
Call to patient. LVM to call office 

## 2022-12-04 NOTE — Telephone Encounter (Signed)
Left voicemail for patient to return call to office.  Dr. Bjorn Pippin. Patient wanted to know if you would like to order Echo before his visit in October. Last echo 2021

## 2022-12-07 NOTE — Telephone Encounter (Signed)
Looks like he had recent admission for possible TIA and outpatient echocardiogram was ordered.  Yes would recommend we order echo

## 2022-12-08 NOTE — Telephone Encounter (Signed)
Left message for patient, order placed for echo and someone will call to schedule.

## 2022-12-21 DIAGNOSIS — Z0000 Encounter for general adult medical examination without abnormal findings: Secondary | ICD-10-CM | POA: Diagnosis not present

## 2022-12-21 DIAGNOSIS — M9901 Segmental and somatic dysfunction of cervical region: Secondary | ICD-10-CM | POA: Diagnosis not present

## 2022-12-21 DIAGNOSIS — I10 Essential (primary) hypertension: Secondary | ICD-10-CM | POA: Diagnosis not present

## 2022-12-21 DIAGNOSIS — M5411 Radiculopathy, occipito-atlanto-axial region: Secondary | ICD-10-CM | POA: Diagnosis not present

## 2022-12-21 DIAGNOSIS — I700 Atherosclerosis of aorta: Secondary | ICD-10-CM | POA: Diagnosis not present

## 2022-12-21 DIAGNOSIS — Z125 Encounter for screening for malignant neoplasm of prostate: Secondary | ICD-10-CM | POA: Diagnosis not present

## 2022-12-21 DIAGNOSIS — M9903 Segmental and somatic dysfunction of lumbar region: Secondary | ICD-10-CM | POA: Diagnosis not present

## 2022-12-21 DIAGNOSIS — E785 Hyperlipidemia, unspecified: Secondary | ICD-10-CM | POA: Diagnosis not present

## 2023-01-07 ENCOUNTER — Ambulatory Visit (HOSPITAL_COMMUNITY): Payer: Medicare HMO | Attending: Cardiology

## 2023-01-07 DIAGNOSIS — G459 Transient cerebral ischemic attack, unspecified: Secondary | ICD-10-CM

## 2023-01-07 LAB — ECHOCARDIOGRAM COMPLETE
Area-P 1/2: 4.31 cm2
Calc EF: 58.7 %
S' Lateral: 2.8 cm
Single Plane A2C EF: 58.9 %
Single Plane A4C EF: 58.1 %

## 2023-01-11 DIAGNOSIS — M9901 Segmental and somatic dysfunction of cervical region: Secondary | ICD-10-CM | POA: Diagnosis not present

## 2023-01-11 DIAGNOSIS — M9903 Segmental and somatic dysfunction of lumbar region: Secondary | ICD-10-CM | POA: Diagnosis not present

## 2023-01-11 DIAGNOSIS — M5411 Radiculopathy, occipito-atlanto-axial region: Secondary | ICD-10-CM | POA: Diagnosis not present

## 2023-02-01 DIAGNOSIS — M9903 Segmental and somatic dysfunction of lumbar region: Secondary | ICD-10-CM | POA: Diagnosis not present

## 2023-02-01 DIAGNOSIS — M5411 Radiculopathy, occipito-atlanto-axial region: Secondary | ICD-10-CM | POA: Diagnosis not present

## 2023-02-01 DIAGNOSIS — M9901 Segmental and somatic dysfunction of cervical region: Secondary | ICD-10-CM | POA: Diagnosis not present

## 2023-03-09 DIAGNOSIS — Z23 Encounter for immunization: Secondary | ICD-10-CM | POA: Diagnosis not present

## 2023-03-23 NOTE — Progress Notes (Signed)
Cardiology Office Note:    Date:  03/28/2023   ID:  Randall Lane, DOB Jan 25, 1946, MRN [id number]  PCP:  Joycelyn Rua, MD  Cardiologist:  Little Ishikawa, MD  Electrophysiologist:  None   Referring MD: Joycelyn Rua, MD   Chief Complaint  Patient presents with   Coronary Artery Disease    History of Present Illness:    Randall Lane is a 77 y.o. male with a hx of hypertension, hyperlipidemia, prediabetes who presents for follow-up.  He was referred by Mitzi Hansen, NP for evaluation of chest pain, initially seen on 08/02/2019.  He reports that about 7 weeks prior he was doing sit ups at the gym.  While doing sit-ups he had the acute onset of upper abdominal pain.  Felt like he was getting punched in his abdomen/lower chest.  Since that time he has had intermittent chest tightness that occurs with certain movements.  Reports tightness in the center of his chest.,  Will last 2 to 3 seconds and resolved.  He normally is very active, goes to the gym at least 2 times per week and rides bicycle and lifts weights, typically spends at least an hour at the gym.  No exertional chest pain.  Denies any smoking history.  Father had PCI in 50s.  Given his description of chest pain consistent with musculoskeletal pain, recommended holding off on ischemia assessment at initial visit.  EKG notable for poor R wave progression, TTE was checked which showed no significant abnormalities.  Calcium score 244 (54th percentile) on 11/26/2020.  Echocardiogram 12/2022 showed normal biventricular function, no significant valvular disease.  Since last clinic visit, he was admitted 11/2022 with left-sided paresthesias, suspected TIA initially but seen by neurology and not felt to be TIA.  Later found to have insect bite at the site which he suspected was the cause of his symptoms.  He reports he has been doing well.  Denies any chest pain, dyspnea, lightheadedness, syncope, lower extremity edema, or  palpitations.  Reports he remains very active, goes to the gym, plays golf once per week, and does yard work.    Past Medical History:  Diagnosis Date   Hyperlipidemia    Hypertension     No past surgical history on file.  Current Medications: Current Meds  Medication Sig   aspirin EC 81 MG tablet Take 1 tablet (81 mg total) by mouth daily. Swallow whole.   atorvastatin (LIPITOR) 80 MG tablet Take 1 tablet (80 mg total) by mouth daily.   Garlic 10 MG CAPS Take 1 capsule by mouth daily.   lisinopril (ZESTRIL) 20 MG tablet Take 10 mg by mouth 2 (two) times daily.   MAGNESIUM PO Take 1 capsule by mouth at bedtime.   Multiple Vitamins-Minerals (MENS 50+ MULTI VITAMIN/MIN PO) Take 1 tablet by mouth daily.   Omega-3 Fatty Acids (FISH OIL) 1000 MG CAPS Take 1,000 mg by mouth daily.   triamcinolone cream (KENALOG) 0.1 % Apply 1 Application topically as needed (flare ups).   verapamil (CALAN-SR) 120 MG CR tablet Take 120 mg by mouth 2 (two) times daily.     Allergies:   Lexapro [escitalopram]   Social History   Socioeconomic History   Marital status: Married    Spouse name: Not on file   Number of children: Not on file   Years of education: Not on file   Highest education level: Not on file  Occupational History   Not on file  Tobacco Use  Smoking status: Never   Smokeless tobacco: Never  Vaping Use   Vaping status: Never Used  Substance and Sexual Activity   Alcohol use: Yes    Comment: occ   Drug use: Never   Sexual activity: Yes  Other Topics Concern   Not on file  Social History Narrative   Not on file   Social Determinants of Health   Financial Resource Strain: Low Risk  (05/11/2018)   Overall Financial Resource Strain (CARDIA)    Difficulty of Paying Living Expenses: Not very hard  Food Insecurity: No Food Insecurity (11/25/2022)   Hunger Vital Sign    Worried About Running Out of Food in the Last Year: Never true    Ran Out of Food in the Last Year: Never true   Transportation Needs: No Transportation Needs (11/25/2022)   PRAPARE - Administrator, Civil Service (Medical): No    Lack of Transportation (Non-Medical): No  Physical Activity: Unknown (05/11/2018)   Exercise Vital Sign    Days of Exercise per Week: Patient declined    Minutes of Exercise per Session: Patient declined  Stress: No Stress Concern Present (05/11/2018)   [employer] of Occupational Health - Occupational Stress Questionnaire    Feeling of Stress : Only a little  Social Connections: Socially Integrated (05/11/2018)   Social Connection and Isolation Panel [NHANES]    Frequency of Communication with Friends and Family: More than three times a week    Frequency of Social Gatherings with Friends and Family: More than three times a week    Attends Religious Services: More than 4 times per year    Active Member of [employer] or Organizations: Yes    Attends Engineer, structural: More than 4 times per year    Marital Status: Married     Family History: Father had PCI in 36s  ROS:   Please see the history of present illness.     All other systems reviewed and are negative.  EKGs/Labs/Other Studies Reviewed:    The following studies were reviewed today:   EKG:   03/23/22: NSR, rate 63, no ST abnormalities. Poor R wave progression 10/22/2020- The EKG ordered today demonstrates normal sinus rhythm, Rate 63, Q waves in lead III and AVF, Poor R wave progression 08/02/2019-The EKG ordered demonstrates normal sinus rhythm, rate 76, sinus arrhythmia: Poor R wave progression  Recent Labs: 11/25/2022: ALT 19; BUN 16; Creatinine, Ser 1.15; Hemoglobin 14.9; Platelets 239; Potassium 4.1; Sodium 137  Recent Lipid Panel    Component Value Date/Time   CHOL 160 11/26/2022 0142   TRIG 243 (H) 11/26/2022 0142   HDL 36 (L) 11/26/2022 0142   CHOLHDL 4.4 11/26/2022 0142   VLDL 49 (H) 11/26/2022 0142   LDLCALC 75 11/26/2022 0142    Physical Exam:    VS:  BP 134/86    Pulse 63   Ht 5\' 8"  (1.727 m)   Wt 185 lb 3.2 oz (84 kg)   SpO2 97%   BMI 28.16 kg/m     Wt Readings from Last 3 Encounters:  03/25/23 185 lb 3.2 oz (84 kg)  11/25/22 180 lb 1.9 oz (81.7 kg)  03/23/22 179 lb 12.8 oz (81.6 kg)     GEN:  Well nourished, well developed in no acute distress HEENT: Normal NECK: No JVD CARDIAC: RRR, no murmurs, rubs, gallops RESPIRATORY:  Clear to auscultation without rales, wheezing or rhonchi  ABDOMEN: Soft, non-tender, non-distended MUSCULOSKELETAL:  No edema; No deformity  SKIN:  Warm and dry NEUROLOGIC:  Alert and oriented x 3 PSYCHIATRIC:  Normal affect   ASSESSMENT:    1. Coronary artery disease involving native coronary artery of native heart without angina pectoris   2. Hyperlipidemia, unspecified hyperlipidemia type   3. Essential hypertension      PLAN:    Chest pain: Description suggests musculoskeletal pain, as describes chest pain with certain movements that lasts few seconds and resolves.  Has resolved.  Denies any exertional chest pain.  No further work-up recommended at this time.   Abnormal EKG: Poor R wave progression on EKG. No evidence of prior MI on TTE 08/12/2019   Hypertension: On verapamil 120 mg twice daily and lisinopril 10 mg twice daily.  Appears controlled   Hyperlipidemia: LDL 88 on 12/2019 on atorvastatin 40 mg daily.  Calcium score 244 (54th percentile) on 11/26/2020.  Atorvastatin increased to 80 mg daily.  LDL 60 on 01/20/22.  Check lipid panel   ?TIA: Admitted 11/2022 with left-sided paresthesia thought initially to be TIA, but on neurology evaluation felt to more likely be related to chiropractor manipulation.  Recommended aspirin, statin.  Echocardiogram 12/2022 showed normal biventricular function, no significant valvular disease.  Later found to have insect bite at the site which he suspected was the cause of his symptoms.   RTC in 1 year  Medication Adjustments/Labs and Tests Ordered: Current medicines are  reviewed at length with the patient today.  Concerns regarding medicines are outlined above.  Orders Placed This Encounter  Procedures   Basic metabolic panel   Lipid panel   No orders of the defined types were placed in this encounter.   Patient Instructions  Medication Instructions:  No changes *If you need a refill on your cardiac medications before your next appointment, please call your pharmacy*   Lab Work: Lipid and BMP- Please return for Blood Work in 1-2 weeks. Fasting blood work. No appointment needed, lab here at the office is open Monday-Friday from 8AM to 4PM and closed daily for lunch from 12:45-1:45.   If you have labs (blood work) drawn today and your tests are completely normal, you will receive your results only by: MyChart Message (if you have MyChart) OR A paper copy in the mail If you have any lab test that is abnormal or we need to change your treatment, we will call you to review the results.   Follow-Up: At [facility], you and your health needs are our priority.  As part of our continuing mission to provide you with exceptional heart care, we have created designated Provider Care Teams.  These Care Teams include your primary Cardiologist (physician) and Advanced Practice Providers (APPs -  Physician Assistants and Nurse Practitioners) who all work together to provide you with the care you need, when you need it.  We recommend signing up for the patient portal called "MyChart".  Sign up information is provided on this After Visit Summary.  MyChart is used to connect with patients for Virtual Visits (Telemedicine).  Patients are able to view lab/test results, encounter notes, upcoming appointments, etc.  Non-urgent messages can be sent to your provider as well.   To learn more about what you can do with MyChart, go to [email or web address].    Your next appointment:   1 year(s)  Provider:   Little Ishikawa, MD           Signed, Little Ishikawa, MD  03/28/2023 8:50 PM    [facility]

## 2023-03-25 ENCOUNTER — Ambulatory Visit: Payer: Medicare HMO | Attending: Cardiology | Admitting: Cardiology

## 2023-03-25 ENCOUNTER — Encounter: Payer: Self-pay | Admitting: Cardiology

## 2023-03-25 VITALS — BP 134/86 | HR 63 | Ht 68.0 in | Wt 185.2 lb

## 2023-03-25 DIAGNOSIS — E785 Hyperlipidemia, unspecified: Secondary | ICD-10-CM | POA: Diagnosis not present

## 2023-03-25 DIAGNOSIS — I10 Essential (primary) hypertension: Secondary | ICD-10-CM

## 2023-03-25 DIAGNOSIS — I2510 Atherosclerotic heart disease of native coronary artery without angina pectoris: Secondary | ICD-10-CM

## 2023-03-25 NOTE — Patient Instructions (Signed)
Medication Instructions:  No changes *If you need a refill on your cardiac medications before your next appointment, please call your pharmacy*   Lab Work: Lipid and BMP- Please return for Blood Work in 1-2 weeks. Fasting blood work. No appointment needed, lab here at the office is open Monday-Friday from 8AM to 4PM and closed daily for lunch from 12:45-1:45.   If you have labs (blood work) drawn today and your tests are completely normal, you will receive your results only by: MyChart Message (if you have MyChart) OR A paper copy in the mail If you have any lab test that is abnormal or we need to change your treatment, we will call you to review the results.   Follow-Up: At [facility], you and your health needs are our priority.  As part of our continuing mission to provide you with exceptional heart care, we have created designated Provider Care Teams.  These Care Teams include your primary Cardiologist (physician) and Advanced Practice Providers (APPs -  Physician Assistants and Nurse Practitioners) who all work together to provide you with the care you need, when you need it.  We recommend signing up for the patient portal called "MyChart".  Sign up information is provided on this After Visit Summary.  MyChart is used to connect with patients for Virtual Visits (Telemedicine).  Patients are able to view lab/test results, encounter notes, upcoming appointments, etc.  Non-urgent messages can be sent to your provider as well.   To learn more about what you can do with MyChart, go to [email or web address].    Your next appointment:   1 year(s)  Provider:   Little Ishikawa, MD

## 2023-04-08 DIAGNOSIS — E785 Hyperlipidemia, unspecified: Secondary | ICD-10-CM | POA: Diagnosis not present

## 2023-04-08 DIAGNOSIS — I10 Essential (primary) hypertension: Secondary | ICD-10-CM | POA: Diagnosis not present

## 2023-04-09 LAB — BASIC METABOLIC PANEL
BUN/Creatinine Ratio: 15 (ref 10–24)
BUN: 17 mg/dL (ref 8–27)
CO2: 27 mmol/L (ref 20–29)
Calcium: 9.5 mg/dL (ref 8.6–10.2)
Chloride: 103 mmol/L (ref 96–106)
Creatinine, Ser: 1.11 mg/dL (ref 0.76–1.27)
Glucose: 104 mg/dL — ABNORMAL HIGH (ref 70–99)
Potassium: 5.4 mmol/L — ABNORMAL HIGH (ref 3.5–5.2)
Sodium: 142 mmol/L (ref 134–144)
eGFR: 69 mL/min/{1.73_m2} (ref >59)

## 2023-04-09 LAB — LIPID PANEL
Chol/HDL Ratio: 4.3 ratio (ref 0.0–5.0)
Cholesterol, Total: 186 mg/dL (ref 100–199)
HDL: 43 mg/dL (ref >39)
LDL Chol Calc (NIH): 95 mg/dL (ref 0–99)
Triglycerides: 289 mg/dL — ABNORMAL HIGH (ref 0–149)
VLDL Cholesterol Cal: 48 mg/dL — ABNORMAL HIGH (ref 5–40)

## 2023-04-12 ENCOUNTER — Other Ambulatory Visit: Payer: Self-pay

## 2023-04-12 DIAGNOSIS — E875 Hyperkalemia: Secondary | ICD-10-CM

## 2023-04-13 ENCOUNTER — Encounter: Payer: Self-pay | Admitting: Cardiology

## 2023-04-15 ENCOUNTER — Other Ambulatory Visit (HOSPITAL_COMMUNITY)
Admission: RE | Admit: 2023-04-15 | Discharge: 2023-04-15 | Disposition: A | Discharge status: Home / Self Care | Payer: Medicare HMO | Source: Ambulatory Visit | Attending: Cardiology | Admitting: Cardiology

## 2023-04-15 DIAGNOSIS — E875 Hyperkalemia: Secondary | ICD-10-CM | POA: Insufficient documentation

## 2023-04-15 LAB — BASIC METABOLIC PANEL
Anion gap: 9 (ref 5–15)
BUN: 17 mg/dL (ref 8–23)
CO2: 24 mmol/L (ref 22–32)
Calcium: 8.7 mg/dL — ABNORMAL LOW (ref 8.9–10.3)
Chloride: 101 mmol/L (ref 98–111)
Creatinine, Ser: 1.09 mg/dL (ref 0.61–1.24)
GFR, Estimated: >60 mL/min (ref >60)
Glucose, Bld: 93 mg/dL (ref 70–99)
Potassium: 3.9 mmol/L (ref 3.5–5.1)
Sodium: 134 mmol/L — ABNORMAL LOW (ref 135–145)

## 2023-06-16 DIAGNOSIS — R051 Acute cough: Secondary | ICD-10-CM | POA: Diagnosis not present

## 2023-06-16 DIAGNOSIS — R509 Fever, unspecified: Secondary | ICD-10-CM | POA: Diagnosis not present

## 2023-06-16 DIAGNOSIS — J3489 Other specified disorders of nose and nasal sinuses: Secondary | ICD-10-CM | POA: Diagnosis not present

## 2023-06-16 DIAGNOSIS — J101 Influenza due to other identified influenza virus with other respiratory manifestations: Secondary | ICD-10-CM | POA: Diagnosis not present

## 2023-06-23 DIAGNOSIS — I10 Essential (primary) hypertension: Secondary | ICD-10-CM | POA: Diagnosis not present

## 2023-06-23 DIAGNOSIS — E785 Hyperlipidemia, unspecified: Secondary | ICD-10-CM | POA: Diagnosis not present

## 2023-07-06 DIAGNOSIS — L814 Other melanin hyperpigmentation: Secondary | ICD-10-CM | POA: Diagnosis not present

## 2023-07-06 DIAGNOSIS — L853 Xerosis cutis: Secondary | ICD-10-CM | POA: Diagnosis not present

## 2023-07-06 DIAGNOSIS — D1801 Hemangioma of skin and subcutaneous tissue: Secondary | ICD-10-CM | POA: Diagnosis not present

## 2023-07-06 DIAGNOSIS — Z86018 Personal history of other benign neoplasm: Secondary | ICD-10-CM | POA: Diagnosis not present

## 2023-07-06 DIAGNOSIS — L821 Other seborrheic keratosis: Secondary | ICD-10-CM | POA: Diagnosis not present

## 2023-11-23 DIAGNOSIS — R051 Acute cough: Secondary | ICD-10-CM | POA: Diagnosis not present

## 2023-11-23 DIAGNOSIS — U071 COVID-19: Secondary | ICD-10-CM | POA: Diagnosis not present

## 2023-12-30 DIAGNOSIS — Z0100 Encounter for examination of eyes and vision without abnormal findings: Secondary | ICD-10-CM | POA: Diagnosis not present

## 2023-12-30 DIAGNOSIS — Z961 Presence of intraocular lens: Secondary | ICD-10-CM | POA: Diagnosis not present

## 2023-12-30 DIAGNOSIS — H52223 Regular astigmatism, bilateral: Secondary | ICD-10-CM | POA: Diagnosis not present

## 2023-12-30 DIAGNOSIS — I10 Essential (primary) hypertension: Secondary | ICD-10-CM | POA: Diagnosis not present

## 2023-12-30 DIAGNOSIS — H5203 Hypermetropia, bilateral: Secondary | ICD-10-CM | POA: Diagnosis not present

## 2023-12-30 DIAGNOSIS — H524 Presbyopia: Secondary | ICD-10-CM | POA: Diagnosis not present

## 2024-01-06 DIAGNOSIS — I10 Essential (primary) hypertension: Secondary | ICD-10-CM | POA: Diagnosis not present

## 2024-01-06 DIAGNOSIS — E785 Hyperlipidemia, unspecified: Secondary | ICD-10-CM | POA: Diagnosis not present

## 2024-01-06 DIAGNOSIS — Z125 Encounter for screening for malignant neoplasm of prostate: Secondary | ICD-10-CM | POA: Diagnosis not present

## 2024-05-28 NOTE — Progress Notes (Deleted)
 " Cardiology Office Note:    Date:  05/28/2024   ID:  Randall Lane, DOB 22-Jan-1946, MRN [id number]  PCP:  Randall Senior, MD  Cardiologist:  Randall LITTIE Nanas, MD  Electrophysiologist:  None   Referring MD: Randall Senior, MD   No chief complaint on file.   History of Present Illness:    Randall Lane is a 79 y.o. male with a hx of hypertension, hyperlipidemia, prediabetes who presents for follow-up.  He was referred by Randall Sieving, NP for evaluation of chest pain, initially seen on 08/02/2019.  He reports that about 7 weeks prior he was doing sit ups at the gym.  While doing sit-ups he had the acute onset of upper abdominal pain.  Felt like he was getting punched in his abdomen/lower chest.  Since that time he has had intermittent chest tightness that occurs with certain movements.  Reports tightness in the center of his chest.,  Will last 2 to 3 seconds and resolved.  He normally is very active, goes to the gym at least 2 times per week and rides bicycle and lifts weights, typically spends at least an hour at the gym.  No exertional chest pain.  Denies any smoking history.  Father had PCI in 83s.  Given his description of chest pain consistent with musculoskeletal pain, recommended holding off on ischemia assessment at initial visit.  EKG notable for poor R wave progression, TTE was checked which showed no significant abnormalities.  Calcium  score 244 (54th percentile) on 11/26/2020.  Echocardiogram 12/2022 showed normal biventricular function, no significant valvular disease.  Since last clinic visit, he was admitted 11/2022 with left-sided paresthesias, suspected TIA initially but seen by neurology and not felt to be TIA.  Later found to have insect bite at the site which he suspected was the cause of his symptoms.  He reports he has been doing well.  Denies any chest pain, dyspnea, lightheadedness, syncope, lower extremity edema, or palpitations.  Reports he remains very active, goes  to the gym, plays golf once per week, and does yard work.    Past Medical History:  Diagnosis Date   Hyperlipidemia    Hypertension     No past surgical history on file.  Current Medications: No outpatient medications have been marked as taking for the 05/29/24 encounter (Appointment) with [name], MD.     Allergies:   Lexapro [escitalopram]   Social History   Socioeconomic History   Marital status: Married    Spouse name: Not on file   Number of children: Not on file   Years of education: Not on file   Highest education level: Not on file  Occupational History   Not on file  Tobacco Use   Smoking status: Never   Smokeless tobacco: Never  Vaping Use   Vaping status: Never Used  Substance and Sexual Activity   Alcohol use: Yes    Comment: occ   Drug use: Never   Sexual activity: Yes  Other Topics Concern   Not on file  Social History Narrative   Not on file   Social Drivers of Health   Tobacco Use: Low Risk (03/25/2023)   Patient History    Smoking Tobacco Use: Never    Smokeless Tobacco Use: Never    Passive Exposure: Not on file  Financial Resource Strain: Not on file  Food Insecurity: No Food Insecurity (11/25/2022)   Hunger Vital Sign    Worried About Running Out of Food in the  Last Year: Never true    Ran Out of Food in the Last Year: Never true  Transportation Needs: No Transportation Needs (11/25/2022)   PRAPARE - Administrator, Civil Service (Medical): No    Lack of Transportation (Non-Medical): No  Physical Activity: Not on file  Stress: Not on file  Social Connections: Not on file  Depression (EYV7-0): Not on file  Alcohol Screen: Not on file  Housing: Low Risk (11/25/2022)   Housing    Last Housing Risk Score: 0  Utilities: Not At Risk (11/25/2022)   AHC Utilities    Threatened with loss of utilities: No  Health Literacy: Not on file     Family History: Father had PCI in 55s  ROS:   Please see the history of  present illness.     All other systems reviewed and are negative.  EKGs/Labs/Other Studies Reviewed:    The following studies were reviewed today:   EKG:   03/23/22: NSR, rate 63, no ST abnormalities. Poor R wave progression 10/22/2020- The EKG ordered today demonstrates normal sinus rhythm, Rate 63, Q waves in lead III and AVF, Poor R wave progression 08/02/2019-The EKG ordered demonstrates normal sinus rhythm, rate 76, sinus arrhythmia: Poor R wave progression  Recent Labs: No results found for requested labs within last 365 days.  Recent Lipid Panel    Component Value Date/Time   CHOL 186 04/08/2023 1001   TRIG 289 (H) 04/08/2023 1001   HDL 43 04/08/2023 1001   CHOLHDL 4.3 04/08/2023 1001   CHOLHDL 4.4 11/26/2022 0142   VLDL 49 (H) 11/26/2022 0142   LDLCALC 95 04/08/2023 1001    Physical Exam:    VS:  There were no vitals taken for this visit.    Wt Readings from Last 3 Encounters:  03/25/23 185 lb 3.2 oz (84 kg)  11/25/22 180 lb 1.9 oz (81.7 kg)  03/23/22 179 lb 12.8 oz (81.6 kg)     GEN:  Well nourished, well developed in no acute distress HEENT: Normal NECK: No JVD CARDIAC: RRR, no murmurs, rubs, gallops RESPIRATORY:  Clear to auscultation without rales, wheezing or rhonchi  ABDOMEN: Soft, non-tender, non-distended MUSCULOSKELETAL:  No edema; No deformity  SKIN: Warm and dry NEUROLOGIC:  Alert and oriented x 3 PSYCHIATRIC:  Normal affect   ASSESSMENT:    No diagnosis found.    PLAN:    Chest pain: Description suggests musculoskeletal pain, as describes chest pain with certain movements that lasts few seconds and resolves.  Has resolved.  Denies any exertional chest pain.  No further work-up recommended at this time.   Abnormal EKG: Poor R wave progression on EKG. No evidence of prior MI on TTE 08/12/2019   Hypertension: On verapamil  120 mg twice daily and lisinopril  10 mg twice daily.  Appears controlled   Hyperlipidemia: LDL 88 on 12/2019 on  atorvastatin  40 mg daily.  Calcium  score 244 (54th percentile) on 11/26/2020.  Atorvastatin  increased to 80 mg daily.  LDL 60 on 01/20/22.  Check lipid panel***   ?TIA: Admitted 11/2022 with left-sided paresthesia thought initially to be TIA, but on neurology evaluation felt to more likely be related to chiropractor manipulation.  Recommended aspirin , statin.  Echocardiogram 12/2022 showed normal biventricular function, no significant valvular disease.  Later found to have insect bite at the site which he suspected was the cause of his symptoms.   RTC in 1 year***  Medication Adjustments/Labs and Tests Ordered: Current medicines are reviewed at length with the  patient today.  Concerns regarding medicines are outlined above.  No orders of the defined types were placed in this encounter.  No orders of the defined types were placed in this encounter.   There are no Patient Instructions on file for this visit.      Signed, Randall LITTIE Nanas, MD  05/28/2024 3:25 PM    [facility] "

## 2024-05-29 ENCOUNTER — Ambulatory Visit: Attending: Cardiology | Admitting: Cardiology

## 2024-05-30 ENCOUNTER — Encounter: Payer: Self-pay | Admitting: Cardiology

## 2024-05-30 NOTE — Progress Notes (Unsigned)
 " Cardiology Office Note:    Date:  05/31/2024   ID:  Randall Lane, DOB August 18, 1945, MRN [id number]  PCP:  Nanci Senior, MD  Cardiologist:  Lonni LITTIE Nanas, MD  Electrophysiologist:  None   Referring MD: Nanci Senior, MD   Chief Complaint  Patient presents with   Hypertension    History of Present Illness:    Randall Lane is a 79 y.o. male with a hx of hypertension, hyperlipidemia, prediabetes who presents for follow-up.  He was referred by Romero Sieving, NP for evaluation of chest pain, initially seen on 08/02/2019.  He reports that about 7 weeks prior he was doing sit ups at the gym.  While doing sit-ups he had the acute onset of upper abdominal pain.  Felt like he was getting punched in his abdomen/lower chest.  Since that time he has had intermittent chest tightness that occurs with certain movements.  Reports tightness in the center of his chest.,  Will last 2 to 3 seconds and resolved.  He normally is very active, goes to the gym at least 2 times per week and rides bicycle and lifts weights, typically spends at least an hour at the gym.  No exertional chest pain.  Denies any smoking history.  Father had PCI in 88s.  Given his description of chest pain consistent with musculoskeletal pain, recommended holding off on ischemia assessment at initial visit.  EKG notable for poor R wave progression, TTE was checked which showed no significant abnormalities.  Calcium  score 244 (54th percentile) on 11/26/2020.  Echocardiogram 12/2022 showed normal biventricular function, no significant valvular disease.  Since last clinic visit, he reports he is doing well.  Denies any chest pain, dyspnea, lightheadedness, syncope, lower extremity edema, or palpitations.  Reports BP 120s when checks at home.  He remains active, goes to gym at least once per week, walks 2 miles twice per week and golfs once per week in summer.    Past Medical History:  Diagnosis Date   Hyperlipidemia     Hypertension     No past surgical history on file.  Current Medications: Current Meds  Medication Sig   aspirin  EC 81 MG tablet Take 1 tablet (81 mg total) by mouth daily. Swallow whole.   atorvastatin  (LIPITOR) 80 MG tablet Take 1 tablet (80 mg total) by mouth daily.   Garlic 10 MG CAPS Take 1 capsule by mouth daily.   lisinopril  (ZESTRIL ) 20 MG tablet Take 10 mg by mouth 2 (two) times daily.   MAGNESIUM PO Take 1 capsule by mouth at bedtime.   Multiple Vitamins-Minerals (MENS 50+ MULTI VITAMIN/MIN PO) Take 1 tablet by mouth daily.   Omega-3 Fatty Acids (FISH OIL) 1000 MG CAPS Take 1,000 mg by mouth daily.   triamcinolone cream (KENALOG) 0.1 % Apply 1 Application topically as needed (flare ups).   verapamil  (CALAN -SR) 120 MG CR tablet Take 120 mg by mouth 2 (two) times daily.     Allergies:   Escitalopram and Hydrochlorothiazide   Social History   Socioeconomic History   Marital status: Married    Spouse name: Not on file   Number of children: Not on file   Years of education: Not on file   Highest education level: Not on file  Occupational History   Not on file  Tobacco Use   Smoking status: Never   Smokeless tobacco: Never  Vaping Use   Vaping status: Never Used  Substance and Sexual Activity   Alcohol use: Yes  Comment: occ   Drug use: Never   Sexual activity: Yes  Other Topics Concern   Not on file  Social History Narrative   Not on file   Social Drivers of Health   Tobacco Use: Low Risk (05/31/2024)   Patient History    Smoking Tobacco Use: Never    Smokeless Tobacco Use: Never    Passive Exposure: Not on file  Financial Resource Strain: Not on file  Food Insecurity: No Food Insecurity (11/25/2022)   Hunger Vital Sign    Worried About Running Out of Food in the Last Year: Never true    Ran Out of Food in the Last Year: Never true  Transportation Needs: No Transportation Needs (11/25/2022)   PRAPARE - Administrator, Civil Service (Medical): No     Lack of Transportation (Non-Medical): No  Physical Activity: Not on file  Stress: Not on file  Social Connections: Not on file  Depression (EYV7-0): Not on file  Alcohol Screen: Not on file  Housing: Low Risk (11/25/2022)   Housing    Last Housing Risk Score: 0  Utilities: Not At Risk (11/25/2022)   AHC Utilities    Threatened with loss of utilities: No  Health Literacy: Not on file     Family History: Father had PCI in 72s  ROS:   Please see the history of present illness.     All other systems reviewed and are negative.  EKGs/Labs/Other Studies Reviewed:    The following studies were reviewed today:   EKG:   05/31/2024: Normal sinus rhythm, rate 72, inferior Q waves, poor R wave progression 03/23/22: NSR, rate 63, no ST abnormalities. Poor R wave progression 10/22/2020- The EKG ordered today demonstrates normal sinus rhythm, Rate 63, Q waves in lead III and AVF, Poor R wave progression 08/02/2019-The EKG ordered demonstrates normal sinus rhythm, rate 76, sinus arrhythmia: Poor R wave progression  Recent Labs: No results found for requested labs within last 365 days.  Recent Lipid Panel    Component Value Date/Time   CHOL 186 04/08/2023 1001   TRIG 289 (H) 04/08/2023 1001   HDL 43 04/08/2023 1001   CHOLHDL 4.3 04/08/2023 1001   CHOLHDL 4.4 11/26/2022 0142   VLDL 49 (H) 11/26/2022 0142   LDLCALC 95 04/08/2023 1001    Physical Exam:    VS:  BP (!) 161/91   Pulse 72   Ht 5' 8 (1.727 m)   Wt 188 lb 3.2 oz (85.4 kg)   SpO2 95%   BMI 28.62 kg/m     Wt Readings from Last 3 Encounters:  05/31/24 188 lb 3.2 oz (85.4 kg)  03/25/23 185 lb 3.2 oz (84 kg)  11/25/22 180 lb 1.9 oz (81.7 kg)     GEN:  Well nourished, well developed in no acute distress HEENT: Normal NECK: No JVD CARDIAC: RRR, no murmurs, rubs, gallops RESPIRATORY:  Clear to auscultation without rales, wheezing or rhonchi  ABDOMEN: Soft, non-tender, non-distended MUSCULOSKELETAL:  No edema; No  deformity  SKIN: Warm and dry NEUROLOGIC:  Alert and oriented x 3 PSYCHIATRIC:  Normal affect   ASSESSMENT:    1. Essential hypertension   2. Hyperlipidemia, unspecified hyperlipidemia type   3. TIA (transient ischemic attack)       PLAN:    Chest pain: Description suggests musculoskeletal pain, as describes chest pain with certain movements that lasts few seconds and resolves.  Has resolved.  Denies any exertional chest pain.  No further work-up recommended at  this time.   Abnormal EKG: Poor R wave progression on EKG. No evidence of prior MI on TTE 08/12/2019   Hypertension: On verapamil  120 mg twice daily and lisinopril  10 mg twice daily.  BP elevated in clinic today but reports has been under control and checks at home.  Asked to check BP twice daily for next week and let us  know results   Hyperlipidemia: LDL 88 on 12/2019 on atorvastatin  40 mg daily.  Calcium  score 244 (54th percentile) on 11/26/2020.  Atorvastatin  increased to 80 mg daily.  LDL 60 on 01/20/22.  LDL 81 on 12/2023  ?TIA: Admitted 11/2022 with left-sided paresthesia thought initially to be TIA, but on neurology evaluation felt to more likely be related to chiropractor manipulation.  Recommended aspirin , statin.  Echocardiogram 12/2022 showed normal biventricular function, no significant valvular disease.  Later found to have insect bite at the site which he suspected was the cause of his symptoms.   RTC in 1 year  Medication Adjustments/Labs and Tests Ordered: Current medicines are reviewed at length with the patient today.  Concerns regarding medicines are outlined above.  Orders Placed This Encounter  Procedures   EKG 12-Lead   No orders of the defined types were placed in this encounter.   Patient Instructions  Medication Instructions:  Your physician recommends that you continue on your current medications as directed. Please refer to the Current Medication list given to you today.  *If you need a refill on your  cardiac medications before your next appointment, please call your pharmacy*  Lab Work: NONE If you have labs (blood work) drawn today and your tests are completely normal, you will receive your results only by: MyChart Message (if you have MyChart) OR A paper copy in the mail If you have any lab test that is abnormal or we need to change your treatment, we will call you to review the results.  Testing/Procedures: None  Follow-Up: At [facility], you and your health needs are our priority.  As part of our continuing mission to provide you with exceptional heart care, our providers are all part of one team.  This team includes your primary Cardiologist (physician) and Advanced Practice Providers or APPs (Physician Assistants and Nurse Practitioners) who all work together to provide you with the care you need, when you need it.  Your next appointment:   1 year  Provider:   Dr. Kate  We recommend signing up for the patient portal called MyChart.  Sign up information is provided on this After Visit Summary.  MyChart is used to connect with patients for Virtual Visits (Telemedicine).  Patients are able to view lab/test results, encounter notes, upcoming appointments, etc.  Non-urgent messages can be sent to your provider as well.   To learn more about what you can do with MyChart, go to [email or web address].   Other Instructions Please check blood pressure twice a day for next week and send those readings              Signed, Lonni LITTIE Kate, MD  05/31/2024 12:08 PM     [facility] "

## 2024-05-31 ENCOUNTER — Encounter: Payer: Self-pay | Admitting: Cardiology

## 2024-05-31 ENCOUNTER — Ambulatory Visit: Admitting: Cardiology

## 2024-05-31 VITALS — BP 161/91 | HR 72 | Ht 68.0 in | Wt 188.2 lb

## 2024-05-31 DIAGNOSIS — G459 Transient cerebral ischemic attack, unspecified: Secondary | ICD-10-CM | POA: Diagnosis not present

## 2024-05-31 DIAGNOSIS — I10 Essential (primary) hypertension: Secondary | ICD-10-CM

## 2024-05-31 DIAGNOSIS — E785 Hyperlipidemia, unspecified: Secondary | ICD-10-CM | POA: Diagnosis not present

## 2024-05-31 NOTE — Patient Instructions (Signed)
 Medication Instructions:  Your physician recommends that you continue on your current medications as directed. Please refer to the Current Medication list given to you today.  *If you need a refill on your cardiac medications before your next appointment, please call your pharmacy*  Lab Work: NONE If you have labs (blood work) drawn today and your tests are completely normal, you will receive your results only by: MyChart Message (if you have MyChart) OR A paper copy in the mail If you have any lab test that is abnormal or we need to change your treatment, we will call you to review the results.  Testing/Procedures: None  Follow-Up: At [facility], you and your health needs are our priority.  As part of our continuing mission to provide you with exceptional heart care, our providers are all part of one team.  This team includes your primary Cardiologist (physician) and Advanced Practice Providers or APPs (Physician Assistants and Nurse Practitioners) who all work together to provide you with the care you need, when you need it.  Your next appointment:   1 year  Provider:   Dr. Kate  We recommend signing up for the patient portal called MyChart.  Sign up information is provided on this After Visit Summary.  MyChart is used to connect with patients for Virtual Visits (Telemedicine).  Patients are able to view lab/test results, encounter notes, upcoming appointments, etc.  Non-urgent messages can be sent to your provider as well.   To learn more about what you can do with MyChart, go to [email or web address].   Other Instructions Please check blood pressure twice a day for next week and send those readings
